# Patient Record
Sex: Female | Born: 1960 | Race: Black or African American | Hispanic: No | Marital: Married | Smoking: Never smoker
Health system: Southern US, Community
[De-identification: ages and names within clinical notes are randomized; demographics above are authoritative.]

## PROBLEM LIST (undated history)

## (undated) DIAGNOSIS — I10 Essential (primary) hypertension: Secondary | ICD-10-CM

## (undated) DIAGNOSIS — J45909 Unspecified asthma, uncomplicated: Secondary | ICD-10-CM

---

## 2014-02-08 ENCOUNTER — Encounter (HOSPITAL_BASED_OUTPATIENT_CLINIC_OR_DEPARTMENT_OTHER): Payer: Self-pay | Admitting: Emergency Medicine

## 2014-02-08 ENCOUNTER — Emergency Department (HOSPITAL_BASED_OUTPATIENT_CLINIC_OR_DEPARTMENT_OTHER)
Admission: EM | Admit: 2014-02-08 | Discharge: 2014-02-08 | Disposition: A | Discharge status: Home / Self Care | Payer: Self-pay | Attending: Emergency Medicine | Admitting: Emergency Medicine

## 2014-02-08 DIAGNOSIS — I10 Essential (primary) hypertension: Secondary | ICD-10-CM | POA: Insufficient documentation

## 2014-02-08 DIAGNOSIS — J45909 Unspecified asthma, uncomplicated: Secondary | ICD-10-CM | POA: Insufficient documentation

## 2014-02-08 DIAGNOSIS — R040 Epistaxis: Secondary | ICD-10-CM | POA: Insufficient documentation

## 2014-02-08 DIAGNOSIS — Z79899 Other long term (current) drug therapy: Secondary | ICD-10-CM | POA: Insufficient documentation

## 2014-02-08 HISTORY — DX: Essential (primary) hypertension: I10

## 2014-02-08 HISTORY — DX: Unspecified asthma, uncomplicated: J45.909

## 2014-02-08 MED ORDER — OXYMETAZOLINE HCL 0.05 % NA SOLN
NASAL | Status: AC
  Administered 2014-02-08: 2
  Filled 2014-02-08: qty 15

## 2014-02-08 MED ORDER — LIDOCAINE HCL 2 % EX GEL
CUTANEOUS | Status: AC
  Administered 2014-02-08: 1
  Filled 2014-02-08: qty 20

## 2014-02-08 NOTE — ED Notes (Signed)
Rt side nose bleed,  Woke and sneezed  Nose started bleeding  Started about 1 hour pta

## 2014-02-08 NOTE — Discharge Instructions (Signed)
Nosebleed °A nosebleed can be caused by many things, including: °· Getting hit hard in the nose. °· Infections. °· Dry nose. °· Colds. °· Medicines. °Your doctor may do lab testing if you get nosebleeds a lot and the cause is not known. °HOME CARE  °· If your nose was packed with material, keep it there until your doctor takes it out. Put the pack back in your nose if the pack falls out. °· Do not blow your nose for 12 hours after the nosebleed. °· Sit up and bend forward if your nose starts bleeding again. Pinch the front half of your nose nonstop for 20 minutes. °· Put petroleum jelly inside your nose every morning if you have a dry nose. °· Use a humidifier to make the air less dry. °· Do not take aspirin. °· Try not to strain, lift, or bend at the waist for many days after the nosebleed. °GET HELP RIGHT AWAY IF:  °· Nosebleeds keep happening and are hard to stop or control. °· You have bleeding or bruises that are not normal on other parts of the body. °· You have a fever. °· The nosebleeds get worse. °· You get lightheaded, feel faint, sweaty, or throw up (vomit) blood. °MAKE SURE YOU:  °· Understand these instructions. °· Will watch your condition. °· Will get help right away if you are not doing well or get worse. °Document Released: 05/04/2008 Document Revised: 10/18/2011 Document Reviewed: 05/04/2008 °[employer]® Patient Information ©2015 [employer]. This information is not intended to replace advice given to you by your health care provider. Make sure you discuss any questions you have with your health care provider. ° °

## 2014-02-08 NOTE — ED Provider Notes (Signed)
CSN: [id number]     Arrival date & time 02/08/14  0028 History   First MD Initiated Contact with Patient 02/08/14 0102     Chief Complaint  Patient presents with  . Epistaxis     (Consider location/radiation/quality/duration/timing/severity/associated sxs/prior Treatment) HPI This is a 53 year old female who comes in tonight secondary to nosebleed. The bleeding had come from her right nares. She cannot identify any instigating factor. She has had nosebleeds in the past. She denies any trauma but has had some nasal congestion and sneezing. She has felt pressure on it but had recurrent bleeding. She's not on any blood thinners. She has not weak or lightheaded. She has not vomited. Past Medical History  Diagnosis Date  . Hypertension   . Bronchitis, asthmatic    History reviewed. No pertinent past surgical history. No family history on file. History  Substance Use Topics  . Smoking status: Never Smoker   . Smokeless tobacco: Not on file  . Alcohol Use: No   OB History   Grav Para Term Preterm Abortions TAB SAB Ect Mult Living                 Review of Systems  All other systems reviewed and are negative.     Allergies  Review of patient's allergies indicates no known allergies.  Home Medications   Prior to Admission medications   Medication Sig Start Date End Date Taking? Authorizing Provider  atenolol (TENORMIN) 50 MG tablet Take 50 mg by mouth daily.   Yes Historical Provider, MD   BP 168/95  Pulse 82  Temp(Src) 98 F (36.7 C) (Oral)  Resp 18  Ht 4\' 10"  (1.473 m)  Wt 165 lb (74.844 kg)  BMI 34.49 kg/m2  SpO2 100% Physical Exam  Nursing note and vitals reviewed. Constitutional: She is oriented to person, place, and time. She appears well-developed and well-nourished.  HENT:  Head: Normocephalic and atraumatic.  Right Ear: External ear normal.  Left Ear: External ear normal.  Nose: Epistaxis is observed.  Eyes: Conjunctivae and EOM are normal. Pupils are  equal, round, and reactive to light.  Neck: Normal range of motion. Neck supple.  Cardiovascular: Normal rate, regular rhythm, normal heart sounds and intact distal pulses.   Pulmonary/Chest: Effort normal and breath sounds normal.  Abdominal: Soft. Bowel sounds are normal.  Musculoskeletal: Normal range of motion.  Neurological: She is alert and oriented to person, place, and time. She has normal reflexes.  Skin: Skin is warm and dry.  Psychiatric: She has a normal mood and affect. Her behavior is normal. Judgment and thought content normal.    ED Course  EPISTAXIS MANAGEMENT Date/Time: 02/08/2014 2:12 AM Performed by: Hilario QuarryAY, Jermarcus Mcfadyen S Authorized by: Hilario QuarryAY, Richanda Darin S Consent: Verbal consent obtained. Patient identity confirmed: verbally with patient Time out: Immediately prior to procedure a "time out" was called to verify the correct patient, procedure, equipment, support staff and site/side marked as required. Local anesthetic: co-phenylcaine spray and topical anesthetic Anesthetic total (ml): Afrin and lidocaine jelly were applied. Treatment site: right anterior Repair method: silver nitrate Post-procedure assessment: bleeding stopped Treatment complexity: simple Patient tolerance: Patient tolerated the procedure well with no immediate complications.   (including critical care time) Labs Review Labs Reviewed - No data to display  Imaging Review No results found.   EKG Interpretation None      MDM   Final diagnoses:  Epistaxis  Essential hypertension    1 epistaxis controlled here with silver nitrate after pressure and  Afrin. Patient educated regarding no nose manipulation and not raising pressure to control bleeding. She understands return precautions. 2 hypertension patient initially. Hypertensive but this has come down to 165/92. She has known hypertension. She will have this rechecked by her doctor next week.    Hilario Quarryanielle S Scottlynn Lindell, MD 02/08/14 [phone]

## 2014-03-16 ENCOUNTER — Emergency Department (HOSPITAL_BASED_OUTPATIENT_CLINIC_OR_DEPARTMENT_OTHER)
Admission: EM | Admit: 2014-03-16 | Discharge: 2014-03-16 | Disposition: A | Discharge status: Home / Self Care | Payer: Self-pay | Attending: Emergency Medicine | Admitting: Emergency Medicine

## 2014-03-16 ENCOUNTER — Encounter (HOSPITAL_BASED_OUTPATIENT_CLINIC_OR_DEPARTMENT_OTHER): Payer: Self-pay | Admitting: Emergency Medicine

## 2014-03-16 DIAGNOSIS — Z79899 Other long term (current) drug therapy: Secondary | ICD-10-CM | POA: Insufficient documentation

## 2014-03-16 DIAGNOSIS — R040 Epistaxis: Secondary | ICD-10-CM | POA: Insufficient documentation

## 2014-03-16 DIAGNOSIS — I10 Essential (primary) hypertension: Secondary | ICD-10-CM | POA: Insufficient documentation

## 2014-03-16 MED ORDER — SILVER NITRATE-POT NITRATE 75-25 % EX MISC
CUTANEOUS | Status: AC
  Filled 2014-03-16: qty 1

## 2014-03-16 MED ORDER — OXYMETAZOLINE HCL 0.05 % NA SOLN
NASAL | Status: AC
  Administered 2014-03-16: 10:00:00
  Filled 2014-03-16: qty 15

## 2014-03-16 NOTE — ED Notes (Signed)
Patient having a nosebleed that started this morning at 7:30. History of same

## 2014-03-16 NOTE — ED Provider Notes (Signed)
CSN: [id number]     Arrival date & time 03/16/14  0808 History   First MD Initiated Contact with Patient 03/16/14 [phone]     Chief Complaint  Patient presents with  . Epistaxis     Patient is a 53 y.o. female presenting with nosebleeds. The history is provided by the patient.  Epistaxis Location:  R nare Severity:  Moderate Duration:  20 minutes Timing:  Constant Progression:  Improving Chronicity:  Recurrent Context: not trauma   Relieved by:  Applying pressure Worsened by:  Nothing tried Associated symptoms: no fever   Risk factors: frequent nosebleeds     Past Medical History  Diagnosis Date  . Hypertension   . Bronchitis, asthmatic    History reviewed. No pertinent past surgical history. No family history on file. History  Substance Use Topics  . Smoking status: Never Smoker   . Smokeless tobacco: Not on file  . Alcohol Use: No   OB History   Grav Para Term Preterm Abortions TAB SAB Ect Mult Living                 Review of Systems  Constitutional: Negative for fever.  HENT: Positive for nosebleeds.       Allergies  Review of patient's allergies indicates no known allergies.  Home Medications   Prior to Admission medications   Medication Sig Start Date End Date Taking? Authorizing Provider  atenolol (TENORMIN) 50 MG tablet Take 50 mg by mouth daily.    Historical Provider, MD   BP 185/91  Pulse 82  Temp(Src) 97.6 F (36.4 C) (Oral)  Resp 20  Ht 4\' 10"  (1.473 m)  Wt 165 lb (74.844 kg)  BMI 34.49 kg/m2  SpO2 98% Physical Exam CONSTITUTIONAL: Well developed/well nourished HEAD: Normocephalic/atraumatic EYES: EOMI/PERRL ENMT: Mucous membranes moist. Pt holding pressure on arrival.  No blood in oropharynx No signs of facial trauma.  No bruising or crepitus to face NECK: supple no meningeal signs CV: S1/S2 noted, no murmurs/rubs/gallops noted LUNGS: Lungs are clear to auscultation bilaterally, no apparent distress ABDOMEN: soft, nontender, no  rebound or guarding NEURO: Pt is awake/alert, moves all extremitiesx4 EXTREMITIES: pulses normal, full ROM SKIN: warm, color normal PSYCH: no abnormalities of mood noted   ED Course  EPISTAXIS MANAGEMENT Date/Time: 03/16/2014 9:15 AM Performed by: Joya GaskinsWICKLINE, Terran Klinke W Authorized by: Joya GaskinsWICKLINE, Ema Hebner W Consent: Verbal consent obtained. Treatment site: right anterior Post-procedure assessment: bleeding stopped Treatment complexity: simple Patient tolerance: Patient tolerated the procedure well with no immediate complications. Comments: Afrin applied No evidence of bleeding after application of afrin and consistent pressure    MDM   Final diagnoses:  Epistaxis  Essential hypertension    Nursing notes including past medical history and social history reviewed and considered in documentation Previous records reviewed and considered     Joya Gaskinsonald W Jalien Weakland, MD 03/16/14 [phone]

## 2014-09-25 ENCOUNTER — Emergency Department (HOSPITAL_BASED_OUTPATIENT_CLINIC_OR_DEPARTMENT_OTHER)
Admission: EM | Admit: 2014-09-25 | Discharge: 2014-09-25 | Disposition: A | Discharge status: Home / Self Care | Payer: Self-pay | Attending: Emergency Medicine | Admitting: Emergency Medicine

## 2014-09-25 ENCOUNTER — Encounter (HOSPITAL_BASED_OUTPATIENT_CLINIC_OR_DEPARTMENT_OTHER): Payer: Self-pay | Admitting: Emergency Medicine

## 2014-09-25 DIAGNOSIS — J45909 Unspecified asthma, uncomplicated: Secondary | ICD-10-CM | POA: Insufficient documentation

## 2014-09-25 DIAGNOSIS — Z79899 Other long term (current) drug therapy: Secondary | ICD-10-CM | POA: Insufficient documentation

## 2014-09-25 DIAGNOSIS — I10 Essential (primary) hypertension: Secondary | ICD-10-CM | POA: Insufficient documentation

## 2014-09-25 DIAGNOSIS — R040 Epistaxis: Secondary | ICD-10-CM | POA: Insufficient documentation

## 2014-09-25 MED ORDER — AMLODIPINE BESYLATE 5 MG PO TABS
10.0000 mg | ORAL_TABLET | Freq: Once | ORAL | Status: AC
  Administered 2014-09-25: 10 mg via ORAL
  Filled 2014-09-25: qty 2

## 2014-09-25 MED ORDER — SALINE SPRAY 0.65 % NA SOLN: 2.0000 | NASAL | Status: AC | PRN

## 2014-09-25 MED ORDER — SILVER NITRATE-POT NITRATE 75-25 % EX MISC
CUTANEOUS | Status: AC
  Filled 2014-09-25: qty 1

## 2014-09-25 MED ORDER — HYDROCHLOROTHIAZIDE 12.5 MG PO TABS: 12.5000 mg | ORAL_TABLET | Freq: Every day | ORAL | Status: AC

## 2014-09-25 MED ORDER — OXYMETAZOLINE HCL 0.05 % NA SOLN
NASAL | Status: AC
  Administered 2014-09-25: 05:00:00
  Filled 2014-09-25: qty 15

## 2014-09-25 NOTE — ED Provider Notes (Signed)
CSN: [id number]     Arrival date & time 09/25/14  [id number] History   First MD Initiated Contact with Patient 09/25/14 [phone]     Chief Complaint  Patient presents with  . Epistaxis     (Consider location/radiation/quality/duration/timing/severity/associated sxs/prior Treatment) Patient is a 54 y.o. female presenting with nosebleeds. The history is provided by the patient.  Epistaxis Location:  L nare Severity:  Moderate Timing:  Constant Progression:  Resolved Chronicity:  New Context: not anticoagulants and not aspirin use   Relieved by:  Nothing Worsened by:  Nothing tried Ineffective treatments:  None tried Associated symptoms: no blood in oropharynx, no congestion, no facial pain, no fever and no sneezing   Risk factors: frequent nosebleeds   Risk factors: no change in medication     Past Medical History  Diagnosis Date  . Hypertension   . Bronchitis, asthmatic    History reviewed. No pertinent past surgical history. History reviewed. No pertinent family history. History  Substance Use Topics  . Smoking status: Never Smoker   . Smokeless tobacco: Not on file  . Alcohol Use: No   OB History    No data available     Review of Systems  Constitutional: Negative for fever.  HENT: Positive for nosebleeds. Negative for congestion, rhinorrhea and sneezing.   All other systems reviewed and are negative.     Allergies  Review of patient's allergies indicates no known allergies.  Home Medications   Prior to Admission medications   Medication Sig Start Date End Date Taking? Authorizing Provider  hydrochlorothiazide (MICROZIDE) 12.5 MG capsule Take 12.5 mg by mouth daily.   Yes Historical Provider, MD  atenolol (TENORMIN) 50 MG tablet Take 50 mg by mouth daily.    Historical Provider, MD   BP 215/109 mmHg  Pulse 69  Temp(Src) 99 F (37.2 C) (Oral)  Resp 18  Ht 4\' 11"  (1.499 m)  Wt 165 lb (74.844 kg)  BMI 33.31 kg/m2  SpO2 100% Physical Exam  Constitutional: She  is oriented to person, place, and time. She appears well-developed and well-nourished. No distress.  HENT:  Head: Normocephalic and atraumatic.  Nose: No nasal deformity, septal deviation or nasal septal hematoma. No epistaxis.  Mouth/Throat: Oropharynx is clear and moist. No oropharyngeal exudate.  Redness in left kisselbach's plexus  Eyes: Conjunctivae are normal. Pupils are equal, round, and reactive to light.  Neck: Normal range of motion. Neck supple.  Cardiovascular: Normal rate, regular rhythm and intact distal pulses.   Pulmonary/Chest: Effort normal and breath sounds normal. No respiratory distress. She has no wheezes. She has no rales.  Abdominal: Soft. Bowel sounds are normal. There is no tenderness.  Musculoskeletal: Normal range of motion.  Neurological: She is alert and oriented to person, place, and time.  Skin: Skin is warm and dry.  Psychiatric: She has a normal mood and affect.    ED Course  Procedures (including critical care time) Labs Review Labs Reviewed - No data to display  Imaging Review No results found.   EKG Interpretation None      MDM   Final diagnoses:  None    Afrin 1 spray in each nostril twice a day x 2 days ONLY then saline drops daily.  Strict return precautions given. Recheck at your family doctor in 48 hours.  Patient is asymptomatic but out of her HCTZ will give 30 day supply    Shah Insley K Meko Masterson-Rasch, MD 09/25/14 [phone]

## 2014-09-25 NOTE — ED Notes (Signed)
Nosebleed starting about ago

## 2014-09-25 NOTE — Discharge Instructions (Signed)
Nosebleed A nosebleed can be caused by many things, including:  Getting hit hard in the nose.  Infections.  Dry nose.  Colds.  Medicines. Your doctor may do lab testing if you get nosebleeds a lot and the cause is not known. HOME CARE   If your nose was packed with material, keep it there until your doctor takes it out. Put the pack back in your nose if the pack falls out.  Do not blow your nose for 12 hours after the nosebleed.  Sit up and bend forward if your nose starts bleeding again. Pinch the front half of your nose nonstop for 20 minutes.  Put petroleum jelly inside your nose every morning if you have a dry nose.  Use a humidifier to make the air less dry.  Do not take aspirin.  Try not to strain, lift, or bend at the waist for many days after the nosebleed. GET HELP RIGHT AWAY IF:   Nosebleeds keep happening and are hard to stop or control.  You have bleeding or bruises that are not normal on other parts of the body.  You have a fever.  The nosebleeds get worse.  You get lightheaded, feel faint, sweaty, or throw up (vomit) blood. MAKE SURE YOU:   Understand these instructions.  Will watch your condition.  Will get help right away if you are not doing well or get worse. Document Released: 05/04/2008 Document Revised: 10/18/2011 Document Reviewed: 05/04/2008 [facility] Patient Information 2015 [place], [place]. This information is not intended to replace advice given to you by your health care provider. Make sure you discuss any questions you have with your health care provider.  Emergency Department Resource Guide 1) Find a Doctor and Pay Out of Pocket Although you won't have to find out who is covered by your insurance plan, it is a good idea to ask around and get recommendations. You will then need to call the office and see if the doctor you have chosen will accept you as a new patient and what types of options they offer for patients who are self-pay. Some  doctors offer discounts or will set up payment plans for their patients who do not have insurance, but you will need to ask so you aren't surprised when you get to your appointment.  2) Contact Your Local Health Department Not all health departments have doctors that can see patients for sick visits, but many do, so it is worth a call to see if yours does. If you don't know where your local health department is, you can check in your phone book. The CDC also has a tool to help you locate your state's health department, and many state websites also have listings of all of their local health departments.  3) Find a Walk-in Clinic If your illness is not likely to be very severe or complicated, you may want to try a walk in clinic. These are popping up all over the country in pharmacies, drugstores, and shopping centers. They're usually staffed by nurse practitioners or physician assistants that have been trained to treat common illnesses and complaints. They're usually fairly quick and inexpensive. However, if you have serious medical issues or chronic medical problems, these are probably not your best option.  No Primary Care Doctor: - Call Health Connect at  [phone] - they can help you locate a primary care doctor that  accepts your insurance, provides certain services, etc. - Physician Referral Service- [phone]  Chronic Pain Problems: Organization  Address  Phone   Notes  [facility]  [phone] Patients need to be referred by their primary care doctor.   Medication Assistance: Organization         Address  Phone   Notes  [facility] Medication [facility] [address] [place], [place] [address] [phone] --Must be a resident of [facility] -- Must have NO insurance coverage whatsoever (no Medicaid/ Medicare, etc.) -- The pt. MUST have a primary care doctor that directs their care regularly and follows them in the  community   MedAssist  [phone]   [employer]  [phone]    Agencies that provide inexpensive medical care: Organization         Address  Phone   Notes  [place]  [phone]   [facility]    [phone]   [facility] [place], [place] [address] [phone]   [place] [address], [place] [phone]   Planned Parenthood    [phone]   [facility]    [phone]   [place] and [address], [place] Phone:  [phone], Fax:  [phone] Hours of Operation:  9 am - 6 pm, M-F.  Also accepts Medicaid/Medicare and self-pay.  [facility] for [place], [address], [place] Phone: [phone], Fax: [phone]. Hours of Operation:  8:30 am - 5:30 pm, M-F.  Also accepts Medicaid and self-pay.  [facility] [place] [address] [place] Phone: [phone]   [place], [place], [place] [phone], Ext. [phone] Mondays & Thursdays: 7-9 AM.  First 15 patients are seen on a first come, first serve basis.    [place] Providers:  Organization         Address  Phone   Notes  [facility] [address], [place] [phone] Also accepts self-pay patients.  [facility] [address], [place]  [phone]   [place], [address], [place] [phone]   [facility] [address], [place] [phone]   Lucianne Lei [address], [place]   [phone] Only accepts [place] Access [place] patients after they have their name applied to their card.   Self-Pay (no insurance) in [place] [facility]:  Organization         Address  Phone   Notes  Sickle Cell Patients, [facility]  Internal Medicine [place] [phone]   [facility] Urgent Care [place] [phone]   [facility] [place]  [place], [place], [place] [phone]   [facility]/Dr. Osei-Bonsu  [address], [place] or [address], [place] [phone] Phone number for both [place] and [place] locations is the same.  Urgent Medical and [facility] [address] [place] [phone]   [facility] [address] [place] or [address] [phone]   [facility] [address] [place] [phone], phone; [phone], fax Sees patients 1st and 3rd Saturday of every month.  Must not qualify  for public or private insurance (i.e. Medicaid, Medicare, [place] Health Choice, Veterans' Benefits)  Household income should be no more than 200% of the poverty level The clinic cannot treat you if you are pregnant or think you are pregnant  Sexually transmitted diseases are not treated at the clinic.    Dental Care: Organization         Address  Phone  Notes  [facility] [place] [phone] Accepts children up to age 69 who are enrolled in [place] or [place]; pregnant women with a Medicaid card; and children who have applied for Medicaid or [place] Health Choice, but were declined, whose parents can pay a reduced fee at time of service.  [facility] Department of [facility]  [address], [place] [phone] Accepts children up to age 63 who are enrolled in [place] or [place]; pregnant women with a Medicaid card; and children who have applied for Medicaid or [place] Health Choice, but were declined, whose parents can pay a reduced fee at time of service.  [place] Adult Dental Access PROGRAM  [place] [phone] Patients are seen by appointment only. Walk-ins are not accepted. [place] will see patients 2 years of age and older. Monday - Tuesday (8am-5pm) Most Wednesdays (8:30-5pm) $30 per visit, cash only  [facility] Adult Dental Access PROGRAM  [address], [facility] [phone] Patients are seen by appointment only. Walk-ins are not accepted. [place] will see patients 63 years of age and older. One Wednesday Evening (Monthly: Volunteer Based).  $30 per visit, cash only  [place]  [phone] for adults; Children under age 59, call Graduate Pediatric Dentistry at [phone]. Children aged 63-14, please call [phone] to request a pediatric application.  Dental services are provided in all areas of dental care including fillings, crowns and bridges, complete and partial dentures, implants, gum treatment, root canals, and extractions. Preventive care is also provided. Treatment is provided to both adults and children. Patients are selected via a lottery and there is often a waiting list.   [facility] [address] [place]  [phone] [email or web address]   [facility] [address] [place], [place] [phone], Ext. [phone] Second and Fourth Thursday of each month, opens at 6:30 AM; Clinic ends at 9 AM.  Patients are seen on a first-come first-served basis, and a limited number are seen during each clinic.   [facility]  [address] Hillard Danker [place], [place] [phone]   Eligibility Requirements You must have lived in [place], [place], or [place] counties for at least the last three months.   You cannot be eligible for state or federal sponsored [employer], including [employer], [place], or [employer].   You generally cannot be eligible for healthcare insurance through your employer.    How to apply: Eligibility screenings are held every  Tuesday and Wednesday afternoon from 1:00 pm until 4:00 pm. You do not need an appointment for the interview!  [facility] [address], [place], [place]   [place]  [place] Department  [place]  [phone]    Behavioral Health Resources in the Community: Intensive Outpatient Programs Organization         Address  Phone  Notes  [facility] [address] [place], [place] [phone]   [facility] Outpatient [address] [place], [place]   ADS: Alcohol & Drug Svcs [address], [place], [place]   [place] [address]  [place], [place] or [phone]   Substance Abuse Resources Organization         Address  Phone  Notes  Alcohol and Drug Services  [phone]   [place]  [phone]   [facility] [place]   Chinita Pester  [phone]   Residential & Outpatient Substance Abuse Program  [phone]   Psychological Services Organization         Address  Phone  Notes  [facility]  [place]  [phone]   [place] [address], [place] or [phone]    Mobile Crisis Teams Organization         Address  Phone  Notes  [facility], Mobile Crisis Care Unit  [phone]   [facility]  [address] [place], [place]   [place] [address] [place] [phone]    Self-Help/Support Groups Organization         Address  Phone             Notes  [place] of [place] - variety of support groups  [place] Call for more information  Narcotics Anonymous (NA), [facility] [address], [employer] [place]  2 meetings at this location    Special educational needs teacher         Address  Phone  Notes  ASAP Residential Treatment [place],    [place]  [phone]   [facility]  [address], [place] [phone], [place], [place]   [place], [place] [phone] Admissions: 8am-3pm M-F  Incentives Substance [place] [address]    [place], [place] [phone]   [facility] [address] [place], [place], [place]   [facility] [address],  [place], [place]   [facility]ight Programs - Intensive Outpatient [address] Kristeen [place] [address], [place], [place]   [facility] ([place].) [place].,  [place], [place] [phone] or [phone]   [facility]idential Treatment Services (RTS) [address] [place], [place] Accepts Medicaid  [facility] [place] [address],  [place] [phone] Substance Abuse/Addiction Treatment   [facility] Organization         Address  Phone  Notes  [facility]  [phone]   Domenic Schwab, PhD [address] Arlis Porta [place], [place]   [phone] or [phone]   [place]   [address] [place], [place] [phone]   [facility] [address], [place], [place] [phone] Insurance/Medicaid/sponsorship through [facility] and Families [address], [place]                                    [place], [place] [phone] [place] [address], [place] [phone]    Dr. Adele Schilder  [phone]   [facility] [place]  [facility] 1) [address], [place] 2) [address] [place] 3)  [address], [place] [phone]  [phone]   [facility] Child Abuse Hotline [phone] or [phone] (After  Hours)

## 2014-09-25 NOTE — ED Notes (Signed)
Bleeding remains controlled at this time.

## 2014-10-20 ENCOUNTER — Encounter (HOSPITAL_BASED_OUTPATIENT_CLINIC_OR_DEPARTMENT_OTHER): Payer: Self-pay | Admitting: *Deleted

## 2014-10-20 ENCOUNTER — Emergency Department (HOSPITAL_BASED_OUTPATIENT_CLINIC_OR_DEPARTMENT_OTHER): Payer: Self-pay

## 2014-10-20 ENCOUNTER — Emergency Department (HOSPITAL_BASED_OUTPATIENT_CLINIC_OR_DEPARTMENT_OTHER)
Admission: EM | Admit: 2014-10-20 | Discharge: 2014-10-20 | Disposition: A | Discharge status: Home / Self Care | Payer: Self-pay | Attending: Emergency Medicine | Admitting: Emergency Medicine

## 2014-10-20 DIAGNOSIS — N200 Calculus of kidney: Secondary | ICD-10-CM

## 2014-10-20 DIAGNOSIS — J45909 Unspecified asthma, uncomplicated: Secondary | ICD-10-CM | POA: Insufficient documentation

## 2014-10-20 DIAGNOSIS — I10 Essential (primary) hypertension: Secondary | ICD-10-CM | POA: Insufficient documentation

## 2014-10-20 DIAGNOSIS — R319 Hematuria, unspecified: Secondary | ICD-10-CM

## 2014-10-20 DIAGNOSIS — Z79899 Other long term (current) drug therapy: Secondary | ICD-10-CM | POA: Insufficient documentation

## 2014-10-20 LAB — URINALYSIS, ROUTINE W REFLEX MICROSCOPIC
Bilirubin Urine: NEGATIVE
Glucose, UA: NEGATIVE mg/dL
Ketones, ur: NEGATIVE mg/dL
Nitrite: NEGATIVE
PROTEIN: NEGATIVE mg/dL
Specific Gravity, Urine: 1.013 (ref 1.005–1.030)
Urobilinogen, UA: 0.2 mg/dL (ref 0.0–1.0)
pH: 6 (ref 5.0–8.0)

## 2014-10-20 LAB — WET PREP, GENITAL
CLUE CELLS WET PREP: NONE SEEN
TRICH WET PREP: NONE SEEN
WBC WET PREP: NONE SEEN
Yeast Wet Prep HPF POC: NONE SEEN

## 2014-10-20 LAB — URINE MICROSCOPIC-ADD ON

## 2014-10-20 MED ORDER — TAMSULOSIN HCL 0.4 MG PO CAPS: 0.4000 mg | ORAL_CAPSULE | Freq: Every day | ORAL | Status: AC

## 2014-10-20 MED ORDER — OXYCODONE-ACETAMINOPHEN 5-325 MG PO TABS: 1.0000 | ORAL_TABLET | ORAL | Status: AC | PRN

## 2014-10-20 MED ORDER — IBUPROFEN 600 MG PO TABS: 600.0000 mg | ORAL_TABLET | Freq: Four times a day (QID) | ORAL | Status: AC | PRN

## 2014-10-20 MED ORDER — KETOROLAC TROMETHAMINE 60 MG/2ML IM SOLN
60.0000 mg | Freq: Once | INTRAMUSCULAR | Status: AC
  Administered 2014-10-20: 60 mg via INTRAMUSCULAR
  Filled 2014-10-20: qty 2

## 2014-10-20 MED ORDER — OXYCODONE-ACETAMINOPHEN 5-325 MG PO TABS
1.0000 | ORAL_TABLET | Freq: Once | ORAL | Status: AC
  Administered 2014-10-20: 1 via ORAL
  Filled 2014-10-20: qty 1

## 2014-10-20 NOTE — ED Provider Notes (Signed)
CSN: [id number]     Arrival date & time 10/20/14  1521 History   First MD Initiated Contact with Patient 10/20/14 1617     Chief Complaint  Patient presents with  . Hematuria     (Consider location/radiation/quality/duration/timing/severity/associated sxs/prior Treatment) HPI Penny Washington is a 54 y.o. female who presents to ED with complaint of abdominal pain and hematuria. Patient states pain is for the last 2 days, today noticed blood in her urine. She denies any vaginal bleeding. She denies any vaginal discharge. No pain with intercourse. States pain is in the right lower abdomen. She denies any back or flank pain. She denies any nausea or vomiting. Normal bowel movement today. No blood in her stool. She has been taking over-the-counter ibuprofen with no relief of her symptoms. She denies any prior history of hematuria. She denies dysuria, urinary frequency or urgency. Nothing is making her symptoms better or worse. She states she has good appetite, no fever or chills.  Past Medical History  Diagnosis Date  . Hypertension   . Bronchitis, asthmatic    History reviewed. No pertinent past surgical history. No family history on file. History  Substance Use Topics  . Smoking status: Never Smoker   . Smokeless tobacco: Not on file  . Alcohol Use: Yes     Comment: rare   OB History    No data available     Review of Systems  Constitutional: Negative for fever and chills.  Respiratory: Negative for cough, chest tightness and shortness of breath.   Cardiovascular: Negative for chest pain, palpitations and leg swelling.  Gastrointestinal: Positive for abdominal pain. Negative for nausea, vomiting and diarrhea.  Genitourinary: Positive for hematuria. Negative for dysuria, flank pain, vaginal bleeding, vaginal discharge, vaginal pain and pelvic pain.  Musculoskeletal: Negative for myalgias, arthralgias, neck pain and neck stiffness.  Skin: Negative for rash.  Neurological: Negative for  dizziness, weakness and headaches.  All other systems reviewed and are negative.     Allergies  Review of patient's allergies indicates no known allergies.  Home Medications   Prior to Admission medications   Medication Sig Start Date End Date Taking? Authorizing Provider  atenolol (TENORMIN) 50 MG tablet Take 25 mg by mouth 2 (two) times daily.    Yes Historical Provider, MD  hydrochlorothiazide (MICROZIDE) 12.5 MG capsule Take 25 mg by mouth daily.    Yes Historical Provider, MD  hydrochlorothiazide (HYDRODIURIL) 12.5 MG tablet Take 1 tablet (12.5 mg total) by mouth daily. 09/25/14   April Palumbo, MD  sodium chloride (OCEAN) 0.65 % SOLN nasal spray Place 2 sprays into both nostrils as needed for congestion. 09/25/14   April Palumbo, MD   BP 153/78 mmHg  Pulse 60  Temp(Src) 98.4 F (36.9 C) (Oral)  Resp 18  Ht 4\' 10"  (1.473 m)  Wt 165 lb (74.844 kg)  BMI 34.49 kg/m2  SpO2 97% Physical Exam  Constitutional: She is oriented to person, place, and time. She appears well-developed and well-nourished. No distress.  HENT:  Head: Normocephalic.  Eyes: Conjunctivae are normal.  Neck: Neck supple.  Cardiovascular: Normal rate, regular rhythm and normal heart sounds.   Pulmonary/Chest: Effort normal and breath sounds normal. No respiratory distress. She has no wheezes. She has no rales.  Abdominal: Soft. Bowel sounds are normal. She exhibits no distension. There is tenderness. There is no rebound.  Right lower quadrant tenderness. No CVA tenderness bilaterally  Genitourinary:  Normal external genitalia. Normal vaginal canal. Small thin white discharge. Cervix is  normal, closed. No CMT. No uterine or adnexal tenderness. No masses palpated.    Musculoskeletal: She exhibits no edema.  Neurological: She is alert and oriented to person, place, and time.  Skin: Skin is warm and dry.  Psychiatric: She has a normal mood and affect. Her behavior is normal.  Nursing note and vitals  reviewed.   ED Course  Procedures (including critical care time) Labs Review Labs Reviewed  URINALYSIS, ROUTINE W REFLEX MICROSCOPIC - Abnormal; Notable for the following:    APPearance CLOUDY (*)    Hgb urine dipstick LARGE (*)    Leukocytes, UA TRACE (*)    All other components within normal limits  WET PREP, GENITAL  URINE MICROSCOPIC-ADD ON  GC/CHLAMYDIA PROBE AMP ()    Imaging Review Ct Abdomen Pelvis Wo Contrast  10/20/2014   CLINICAL DATA:  Right-sided abdominal pain and hematuria starting on Friday. Hypertension.  EXAM: CT ABDOMEN AND PELVIS WITHOUT CONTRAST  TECHNIQUE: Multidetector CT imaging of the abdomen and pelvis was performed following the standard protocol without IV contrast.  COMPARISON:  None.  FINDINGS: Lower chest:  Unremarkable  Hepatobiliary: Unremarkable  Pancreas: Unremarkable  Spleen: Unremarkable  Adrenals/Urinary Tract: Right hydronephrosis and proximal hydroureter. Right proximal periureteral stranding. The ureter extends adjacent to a normal-appearing appendix and adjacent to the right ovary, and is highly indistinct in this vicinity. There is a 4 mm calcification just posterior to the right ovary which could be in the right ureter. The ureter itself in this vicinity is highly indistinct. The distal ureters difficult to visualize.  There is a 2 mm right kidney lower pole nonobstructive calculus. There is a potential 1 mm left kidney lower pole nonobstructive calculus. Fluid density 1.3 cm left mid kidney lesion, image 54 series 5.  Stomach/Bowel: Unremarkable  Vascular/Lymphatic: Aortoiliac atherosclerotic vascular disease.  Reproductive: The uterus is not well visualized. Has the patient had a hysterectomy? I do not see a record of fat in the patient's medical record. It is conceivable that a small retroverted uterus is obscured by surrounding bowel. Correlate with direct questioning of the patient on this issue.  1.6 cm hypodensity along the right ovary,  probably a cyst.  Other: No supplemental non-categorized findings.  Musculoskeletal: Degenerative facet arthropathy bilaterally at L4-5 with vacuum gas phenomenon in the facet joints and grade 1 anterolisthesis. The disc uncovering and facet arthropathy result in bilateral mild foraminal stenosis at this level.  IMPRESSION: 1. Right hydronephrosis and proximal hydroureter, probably due to a 4 mm stone in the pelvis. The distal ureter is highly indistinct and accordingly it is difficult to no on today's noncontrast exam whether this stone is in the ureter or not. However, it is the most likely candidate for the right hydronephrosis. 2. Bilateral tiny punctate nonobstructive renal calculi. 3.  Aortoiliac atherosclerotic vascular disease. 4. Small cyst, right ovary. 5. Uterus not well visualized.  Has the patient had a hysterectomy? 6. Bilateral mild foraminal impingement at L4-5 due to spondylosis and degenerative disc disease.   Electronically Signed   By: Gaylyn Rong M.D.   On: 10/20/2014 19:11     EKG Interpretation None      MDM   Final diagnoses:  Kidney stone  Hematuria   patient with right lower quadrant pain and hematuria. Her pelvic exam is unremarkable. No blood in vaginal canal. Her UA is significant for hematuria, no evidence of infection. There is no adnexal tenderness on exam, pain is higher. CT abdomen pelvis obtained for further evaluation.  8:02 PM Patient CT scan shows a 4 mm stone in pelvis. Right hydronephrosis and proximal hydroureter noted. This is most likely was causing patient's symptoms. Patient also has a small cyst in right ovary. Patient's symptoms are well-controlled at this time. Plan to discharge home with pain medications, Flomax, follow-up with urology. Discussed results with patient and discussed plan. Patient agrees with the plan. Return precautions discussed. Pt afebrile, no signs of urine infection, no spesis.   Filed Vitals:   10/20/14 1608 10/20/14  1820  BP: 177/95 153/78  Pulse: 78 60  Temp: 98.4 F (36.9 C)   TempSrc: Oral   Resp: 18 18  Height:  (1.473 m)   Weight: 165 lb (74.844 kg)   SpO2: 98% 97%     Jaynie Crumble, PA-C 10/21/14 0000  Jerelyn [place], MD 10/21/14 0004

## 2014-10-20 NOTE — Discharge Instructions (Signed)
Ibuprofen for pain. Percocet for severe pain. Flomax to help you passed the stone. Make sure you follow up with urology. Return if worsening symptoms, fever, any new concerning symptoms.   Kidney Stones Kidney stones (urolithiasis) are deposits that form inside your kidneys. The intense pain is caused by the stone moving through the urinary tract. When the stone moves, the ureter goes into spasm around the stone. The stone is usually passed in the urine.  CAUSES   A disorder that makes certain neck glands produce too much parathyroid hormone (primary hyperparathyroidism).  A buildup of uric acid crystals, similar to gout in your joints.  Narrowing (stricture) of the ureter.  A kidney obstruction present at birth (congenital obstruction).  Previous surgery on the kidney or ureters.  Numerous kidney infections. SYMPTOMS   Feeling sick to your stomach (nauseous).  Throwing up (vomiting).  Blood in the urine (hematuria).  Pain that usually spreads (radiates) to the groin.  Frequency or urgency of urination. DIAGNOSIS   Taking a history and physical exam.  Blood or urine tests.  CT scan.  Occasionally, an examination of the inside of the urinary bladder (cystoscopy) is performed. TREATMENT   Observation.  Increasing your fluid intake.  Extracorporeal shock wave lithotripsy--This is a noninvasive procedure that uses shock waves to break up kidney stones.  Surgery may be needed if you have severe pain or persistent obstruction. There are various surgical procedures. Most of the procedures are performed with the use of small instruments. Only small incisions are needed to accommodate these instruments, so recovery time is minimized. The size, location, and chemical composition are all important variables that will determine the proper choice of action for you. Talk to your health care provider to better understand your situation so that you will minimize the risk of injury to  yourself and your kidney.  HOME CARE INSTRUCTIONS   Drink enough water and fluids to keep your urine clear or pale yellow. This will help you to pass the stone or stone fragments.  Strain all urine through the provided strainer. Keep all particulate matter and stones for your health care provider to see. The stone causing the pain may be as small as a grain of salt. It is very important to use the strainer each and every time you pass your urine. The collection of your stone will allow your health care provider to analyze it and verify that a stone has actually passed. The stone analysis will often identify what you can do to reduce the incidence of recurrences.  Only take over-the-counter or prescription medicines for pain, discomfort, or fever as directed by your health care provider.  Make a follow-up appointment with your health care provider as directed.  Get follow-up X-rays if required. The absence of pain does not always mean that the stone has passed. It may have only stopped moving. If the urine remains completely obstructed, it can cause loss of kidney function or even complete destruction of the kidney. It is your responsibility to make sure X-rays and follow-ups are completed. Ultrasounds of the kidney can show blockages and the status of the kidney. Ultrasounds are not associated with any radiation and can be performed easily in a matter of minutes. SEEK MEDICAL CARE IF:  You experience pain that is progressive and unresponsive to any pain medicine you have been prescribed. SEEK IMMEDIATE MEDICAL CARE IF:   Pain cannot be controlled with the prescribed medicine.  You have a fever or shaking chills.  The  severity or intensity of pain increases over 18 hours and is not relieved by pain medicine.  You develop a new onset of abdominal pain.  You feel faint or pass out.  You are unable to urinate. MAKE SURE YOU:   Understand these instructions.  Will watch your  condition.  Will get help right away if you are not doing well or get worse. Document Released: 07/26/2005 Document Revised: 03/28/2013 Document Reviewed: 12/27/2012 [facility] Patient Information 2015 [place], [place]. This information is not intended to replace advice given to you by your health care provider. Make sure you discuss any questions you have with your health care provider.

## 2014-10-20 NOTE — ED Notes (Signed)
Right side abdominal pain and hematuria since friday

## 2014-10-22 LAB — GC/CHLAMYDIA PROBE AMP (~~LOC~~) NOT AT ARMC
Chlamydia: NEGATIVE
Neisseria Gonorrhea: NEGATIVE

## 2015-05-15 ENCOUNTER — Encounter (HOSPITAL_BASED_OUTPATIENT_CLINIC_OR_DEPARTMENT_OTHER): Payer: Self-pay | Admitting: *Deleted

## 2015-05-15 ENCOUNTER — Emergency Department (HOSPITAL_BASED_OUTPATIENT_CLINIC_OR_DEPARTMENT_OTHER)
Admission: EM | Admit: 2015-05-15 | Discharge: 2015-05-15 | Disposition: A | Discharge status: Home / Self Care | Payer: Self-pay | Attending: Emergency Medicine | Admitting: Emergency Medicine

## 2015-05-15 DIAGNOSIS — Z3202 Encounter for pregnancy test, result negative: Secondary | ICD-10-CM | POA: Insufficient documentation

## 2015-05-15 DIAGNOSIS — B373 Candidiasis of vulva and vagina: Secondary | ICD-10-CM | POA: Insufficient documentation

## 2015-05-15 DIAGNOSIS — N760 Acute vaginitis: Secondary | ICD-10-CM | POA: Insufficient documentation

## 2015-05-15 DIAGNOSIS — L309 Dermatitis, unspecified: Secondary | ICD-10-CM | POA: Insufficient documentation

## 2015-05-15 DIAGNOSIS — B9689 Other specified bacterial agents as the cause of diseases classified elsewhere: Secondary | ICD-10-CM

## 2015-05-15 DIAGNOSIS — Z79899 Other long term (current) drug therapy: Secondary | ICD-10-CM | POA: Insufficient documentation

## 2015-05-15 DIAGNOSIS — I10 Essential (primary) hypertension: Secondary | ICD-10-CM | POA: Insufficient documentation

## 2015-05-15 DIAGNOSIS — B3731 Acute candidiasis of vulva and vagina: Secondary | ICD-10-CM

## 2015-05-15 DIAGNOSIS — J45909 Unspecified asthma, uncomplicated: Secondary | ICD-10-CM | POA: Insufficient documentation

## 2015-05-15 LAB — URINALYSIS, ROUTINE W REFLEX MICROSCOPIC
Bilirubin Urine: NEGATIVE
Glucose, UA: NEGATIVE mg/dL
Ketones, ur: NEGATIVE mg/dL
Nitrite: NEGATIVE
PH: 6 (ref 5.0–8.0)
PROTEIN: NEGATIVE mg/dL
SPECIFIC GRAVITY, URINE: 1.024 (ref 1.005–1.030)
Urobilinogen, UA: 1 mg/dL (ref 0.0–1.0)

## 2015-05-15 LAB — WET PREP, GENITAL: TRICH WET PREP: NONE SEEN

## 2015-05-15 LAB — URINE MICROSCOPIC-ADD ON

## 2015-05-15 LAB — PREGNANCY, URINE: Preg Test, Ur: NEGATIVE

## 2015-05-15 MED ORDER — TRIAMCINOLONE ACETONIDE 0.1 % EX CREA: 1.0000 "application " | TOPICAL_CREAM | Freq: Two times a day (BID) | CUTANEOUS | Status: AC

## 2015-05-15 MED ORDER — METRONIDAZOLE 500 MG PO TABS
500.0000 mg | ORAL_TABLET | Freq: Two times a day (BID) | ORAL | Status: AC
Start: 2015-05-15 — End: ?

## 2015-05-15 MED ORDER — FLUCONAZOLE 150 MG PO TABS
ORAL_TABLET | ORAL | Status: AC
Start: 2015-05-15 — End: ?

## 2015-05-15 NOTE — ED Notes (Signed)
Pt. Reports she has had a shooting pain in the R side of her vagina.  Pt. Reports she has had discharge also with the color of white and some color.  Pt. reports a rash on her arms also.  Pt. Reports she has started using dial soap and some new detergent also.

## 2015-05-15 NOTE — ED Provider Notes (Signed)
CSN: [id number]     Arrival date & time 05/15/15  1547 History   First MD Initiated Contact with Patient 05/15/15 1559     Chief Complaint  Patient presents with  . Vaginal Pain     ) HPI  Patient presents evaluation of some vaginal pain and discharge. Patient was moving some boxes and thinks she may have had just a pulled muscle in her groin. There is some discharge intermittent over the last few weeks with some itching and burning and presents for evaluation.  Alternately she has some areas on her arms that are itching. In speaking with her she states that he feels similar to where she's had eczema on her neck in the past. Also states using a new soap at home.  Past Medical History  Diagnosis Date  . Hypertension   . Bronchitis, asthmatic    History reviewed. No pertinent past surgical history. No family history on file. Social History  Substance Use Topics  . Smoking status: Never Smoker   . Smokeless tobacco: None  . Alcohol Use: Yes     Comment: rare   OB History    No data available     Review of Systems  Constitutional: Negative for fever, chills, diaphoresis, appetite change and fatigue.  HENT: Negative for mouth sores, sore throat and trouble swallowing.   Eyes: Negative for visual disturbance.  Respiratory: Negative for cough, chest tightness, shortness of breath and wheezing.   Cardiovascular: Negative for chest pain.  Gastrointestinal: Negative for nausea, vomiting, abdominal pain, diarrhea and abdominal distention.  Endocrine: Negative for polydipsia, polyphagia and polyuria.  Genitourinary: Positive for vaginal discharge and vaginal pain. Negative for dysuria, frequency and hematuria.  Musculoskeletal: Negative for gait problem.  Skin: Positive for rash. Negative for color change and pallor.  Neurological: Negative for dizziness, syncope, light-headedness and headaches.  Hematological: Does not bruise/bleed easily.  Psychiatric/Behavioral: Negative for  behavioral problems and confusion.      Allergies  Review of patient's allergies indicates no known allergies.  Home Medications   Prior to Admission medications   Medication Sig Start Date End Date Taking? Authorizing Provider  lisinopril-hydrochlorothiazide (PRINZIDE,ZESTORETIC) 20-12.5 MG tablet Take 1 tablet by mouth daily.   Yes Historical Provider, MD  atenolol (TENORMIN) 50 MG tablet Take 25 mg by mouth 2 (two) times daily.     Historical Provider, MD  fluconazole (DIFLUCAN) 150 MG tablet 1 tablet by mouth on the last day of Flagyl. 05/15/15   Rolland Porter, MD  hydrochlorothiazide (HYDRODIURIL) 12.5 MG tablet Take 1 tablet (12.5 mg total) by mouth daily. 09/25/14   April Palumbo, MD  hydrochlorothiazide (MICROZIDE) 12.5 MG capsule Take 25 mg by mouth daily.     Historical Provider, MD  ibuprofen (ADVIL,MOTRIN) 600 MG tablet Take 1 tablet (600 mg total) by mouth every 6 (six) hours as needed. 10/20/14   Tatyana Kirichenko, PA-C  metroNIDAZOLE (FLAGYL) 500 MG tablet Take 1 tablet (500 mg total) by mouth 2 (two) times daily. 05/15/15   Rolland Porter, MD  oxyCODONE-acetaminophen (PERCOCET) 5-325 MG per tablet Take 1 tablet by mouth every 4 (four) hours as needed for severe pain. 10/20/14   Tatyana Kirichenko, PA-C  sodium chloride (OCEAN) 0.65 % SOLN nasal spray Place 2 sprays into both nostrils as needed for congestion. 09/25/14   April Palumbo, MD  tamsulosin (FLOMAX) 0.4 MG CAPS capsule Take 1 capsule (0.4 mg total) by mouth daily. 10/20/14   Tatyana Kirichenko, PA-C   BP 156/98 mmHg  Pulse  73  Temp(Src) 98 F (36.7 C) (Oral)  Resp 18  Ht  (1.473 m)  Wt 165 lb (74.844 kg)  BMI 34.49 kg/m2  SpO2 98% Physical Exam  Constitutional: She is oriented to person, place, and time. She appears well-developed and well-nourished. No distress.  HENT:  Head: Normocephalic.  Eyes: Conjunctivae are normal. Pupils are equal, round, and reactive to light. No scleral icterus.  Neck: Normal range of  motion. Neck supple. No thyromegaly present.  Cardiovascular: Normal rate and regular rhythm.  Exam reveals no gallop and no friction rub.   No murmur heard. Pulmonary/Chest: Effort normal and breath sounds normal. No respiratory distress. She has no wheezes. She has no rales.  Abdominal: Soft. Bowel sounds are normal. She exhibits no distension. There is no tenderness. There is no rebound.  Genitourinary:  No abscess or vesicles. Some tenderness along the right groin musculature.  Musculoskeletal: Normal range of motion.  Neurological: She is alert and oriented to person, place, and time.  Skin: Skin is warm and dry. No rash noted.     Psychiatric: She has a normal mood and affect. Her behavior is normal.    ED Course  Procedures (including critical care time) Labs Review Labs Reviewed  WET PREP, GENITAL - Abnormal; Notable for the following:    Yeast Wet Prep HPF POC FEW (*)    Clue Cells Wet Prep HPF POC FEW (*)    WBC, Wet Prep HPF POC FEW (*)    All other components within normal limits  URINALYSIS, ROUTINE W REFLEX MICROSCOPIC (NOT AT [facility]) - Abnormal; Notable for the following:    APPearance TURBID (*)    Hgb urine dipstick MODERATE (*)    Leukocytes, UA SMALL (*)    All other components within normal limits  URINE MICROSCOPIC-ADD ON - Abnormal; Notable for the following:    Squamous Epithelial / LPF FEW (*)    Bacteria, UA FEW (*)    All other components within normal limits  PREGNANCY, URINE  GC/CHLAMYDIA PROBE AMP () NOT AT [facility]    Imaging Review No results found. I have personally reviewed and evaluated these images and lab results as part of my medical decision-making.   EKG Interpretation None      MDM   Final diagnoses:  Candida vaginitis  Bacterial vaginosis    Has clue cells and yeast on pelvic exam. I think her tenderness and pain are likely muscular. Plan will be 7 days Flagyl, 1 day Diflucan. Steroid cream for the eczema.    Rolland Porter, MD 05/15/15 1723

## 2015-05-15 NOTE — Discharge Instructions (Signed)
Bacterial Vaginosis Bacterial vaginosis is a vaginal infection that occurs when the normal balance of bacteria in the vagina is disrupted. It results from an overgrowth of certain bacteria. This is the most common vaginal infection in women of childbearing age. Treatment is important to prevent complications, especially in pregnant women, as it can cause a premature delivery. CAUSES  Bacterial vaginosis is caused by an increase in harmful bacteria that are normally present in smaller amounts in the vagina. Several different kinds of bacteria can cause bacterial vaginosis. However, the reason that the condition develops is not fully understood. RISK FACTORS Certain activities or behaviors can put you at an increased risk of developing bacterial vaginosis, including:  Having a new sex partner or multiple sex partners.  Douching.  Using an intrauterine device (IUD) for contraception. Women do not get bacterial vaginosis from toilet seats, bedding, swimming pools, or contact with objects around them. SIGNS AND SYMPTOMS  Some women with bacterial vaginosis have no signs or symptoms. Common symptoms include:  Grey vaginal discharge.  A fishlike odor with discharge, especially after sexual intercourse.  Itching or burning of the vagina and vulva.  Burning or pain with urination. DIAGNOSIS  Your health care provider will take a medical history and examine the vagina for signs of bacterial vaginosis. A sample of vaginal fluid may be taken. Your health care provider will look at this sample under a microscope to check for bacteria and abnormal cells. A vaginal pH test may also be done.  TREATMENT  Bacterial vaginosis may be treated with antibiotic medicines. These may be given in the form of a pill or a vaginal cream. A second round of antibiotics may be prescribed if the condition comes back after treatment. Because bacterial vaginosis increases your risk for sexually transmitted diseases, getting  treated can help reduce your risk for chlamydia, gonorrhea, HIV, and herpes. HOME CARE INSTRUCTIONS   Only take over-the-counter or prescription medicines as directed by your health care provider.  If antibiotic medicine was prescribed, take it as directed. Make sure you finish it even if you start to feel better.  Tell all sexual partners that you have a vaginal infection. They should see their health care provider and be treated if they have problems, such as a mild rash or itching.  During treatment, it is important that you follow these instructions:  Avoid sexual activity or use condoms correctly.  Do not douche.  Avoid alcohol as directed by your health care provider.  Avoid breastfeeding as directed by your health care provider. SEEK MEDICAL CARE IF:   Your symptoms are not improving after 3 days of treatment.  You have increased discharge or pain.  You have a fever. MAKE SURE YOU:   Understand these instructions.  Will watch your condition.  Will get help right away if you are not doing well or get worse. FOR MORE INFORMATION  Centers for Disease Control and Prevention, Division of STD Prevention: www.cdc.gov/std American Sexual Health Association (ASHA): www.ashastd.org    This information is not intended to replace advice given to you by your health care provider. Make sure you discuss any questions you have with your health care provider.   Document Released: 07/26/2005 Document Revised: 08/16/2014 Document Reviewed: 03/07/2013 Elsevier Interactive Patient Education 2016 [employer] Monilial Vaginitis Vaginitis in a soreness, swelling and redness (inflammation) of the vagina and vulva. Monilial vaginitis is not a sexually transmitted infection. CAUSES  Yeast vaginitis is caused by yeast (candida) that is normally found in   your vagina. With a yeast infection, the candida has overgrown in number to a point that upsets the chemical balance. SYMPTOMS   White,  thick vaginal discharge.  Swelling, itching, redness and irritation of the vagina and possibly the lips of the vagina (vulva).  Burning or painful urination.  Painful intercourse. DIAGNOSIS  Things that may contribute to monilial vaginitis are:  Postmenopausal and virginal states.  Pregnancy.  Infections.  Being tired, sick or stressed, especially if you had monilial vaginitis in the past.  Diabetes. Good control will help lower the chance.  Birth control pills.  Tight fitting garments.  Using bubble bath, feminine sprays, douches or deodorant tampons.  Taking certain medications that kill germs (antibiotics).  Sporadic recurrence can occur if you become ill. TREATMENT  Your caregiver will give you medication.  There are several kinds of anti monilial vaginal creams and suppositories specific for monilial vaginitis. For recurrent yeast infections, use a suppository or cream in the vagina 2 times a week, or as directed.  Anti-monilial or steroid cream for the itching or irritation of the vulva may also be used. Get your caregiver's permission.  Painting the vagina with methylene blue solution may help if the monilial cream does not work.  Eating yogurt may help prevent monilial vaginitis. HOME CARE INSTRUCTIONS   Finish all medication as prescribed.  Do not have sex until treatment is completed or after your caregiver tells you it is okay.  Take warm sitz baths.  Do not douche.  Do not use tampons, especially scented ones.  Wear cotton underwear.  Avoid tight pants and panty hose.  Tell your sexual partner that you have a yeast infection. They should go to their caregiver if they have symptoms such as mild rash or itching.  Your sexual partner should be treated as well if your infection is difficult to eliminate.  Practice safer sex. Use condoms.  Some vaginal medications cause latex condoms to fail. Vaginal medications that harm condoms are:  Cleocin  cream.  Butoconazole (Femstat).  Terconazole (Terazol) vaginal suppository.  Miconazole (Monistat) (may be purchased over the counter). SEEK MEDICAL CARE IF:   You have a temperature by mouth above 102 F (38.9 C).  The infection is getting worse after 2 days of treatment.  The infection is not getting better after 3 days of treatment.  You develop blisters in or around your vagina.  You develop vaginal bleeding, and it is not your menstrual period.  You have pain when you urinate.  You develop intestinal problems.  You have pain with sexual intercourse.   This information is not intended to replace advice given to you by your health care provider. Make sure you discuss any questions you have with your health care provider.   Document Released: 05/05/2005 Document Revised: 10/18/2011 Document Reviewed: 01/27/2015 Elsevier Interactive Patient Education 2016 Elsevier Inc.  

## 2015-05-16 LAB — GC/CHLAMYDIA PROBE AMP (~~LOC~~) NOT AT ARMC
Chlamydia: NEGATIVE
Neisseria Gonorrhea: NEGATIVE

## 2016-10-13 IMAGING — CT CT ABD-PELV W/O CM
2 of 4 series · 16 of 46 positions shown, 18 images · non-contrast
Comparison: None.

CLINICAL DATA: Right-sided abdominal pain and hematuria starting on
[REDACTED]. Hypertension.

EXAM:
CT ABDOMEN AND PELVIS WITHOUT CONTRAST
TECHNIQUE: Multidetector CT imaging of the abdomen and pelvis was performed
following the standard protocol without IV contrast.

[Series 2: renal stone < 200 lbs 5.0 b31f · axial · 0.77mm/px · z∈[-409,-49]mm · 13 of 80 slices shown, 15 images]
[im 4/80  soft-tissue]
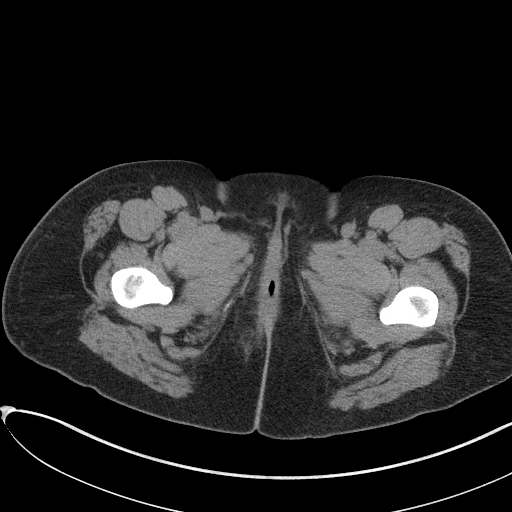
[im 4/80  bone]
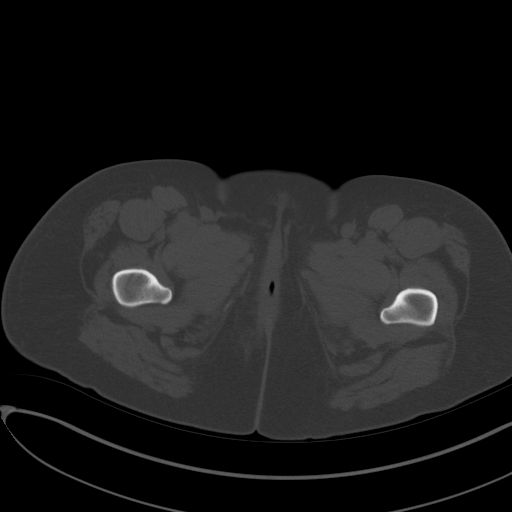
[im 10/80  soft-tissue]
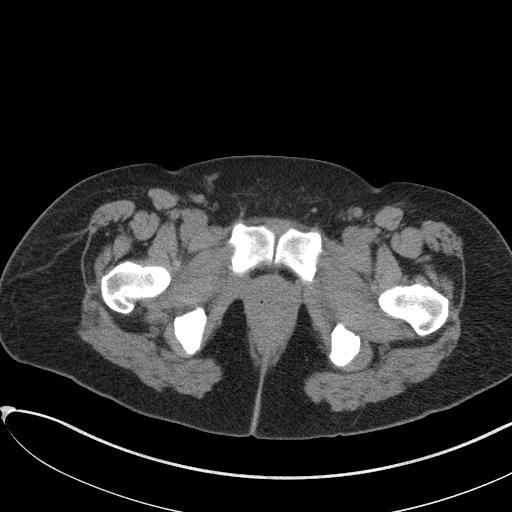
[im 16/80  soft-tissue]
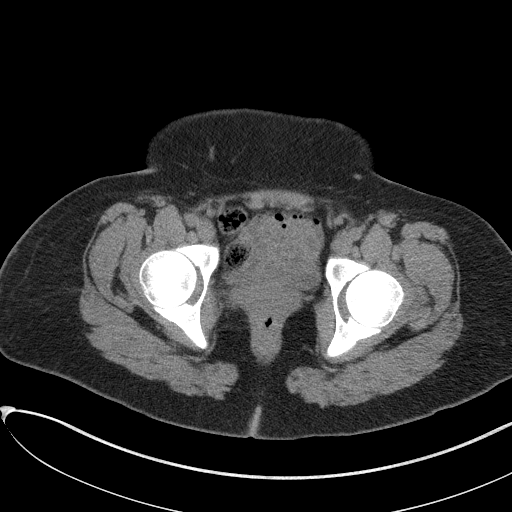
[im 22/80  soft-tissue]
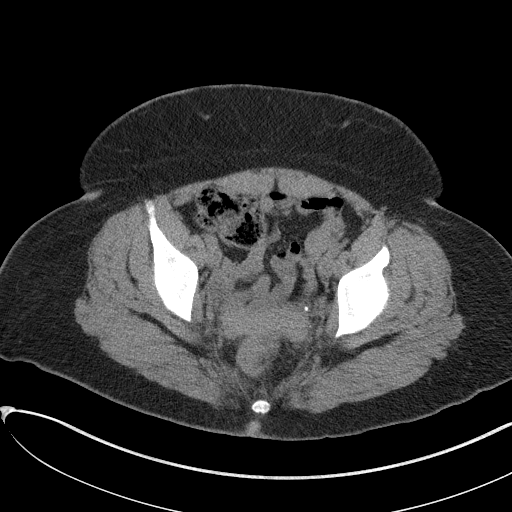
[im 28/80  soft-tissue]
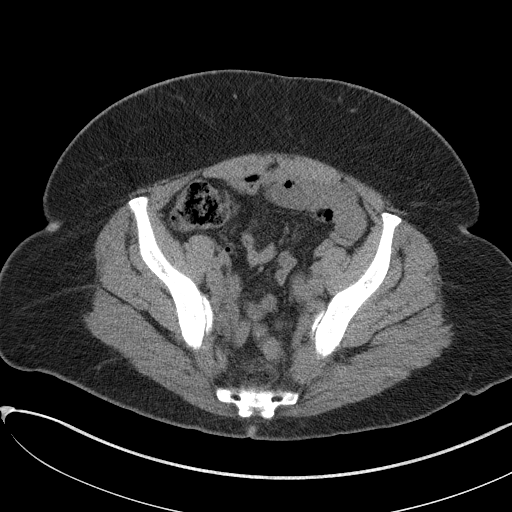
[im 34/80  soft-tissue]
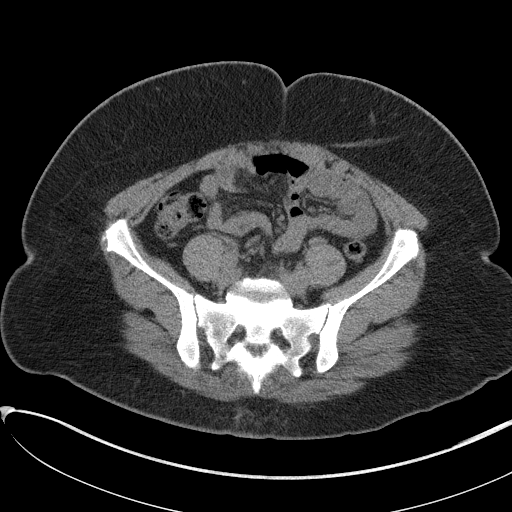
[im 40/80  soft-tissue]
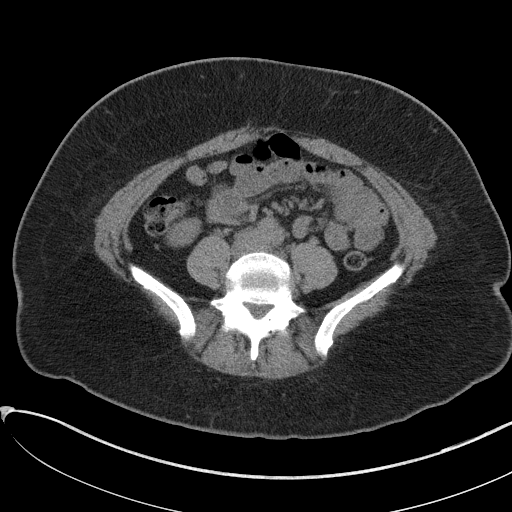
[im 46/80  soft-tissue]
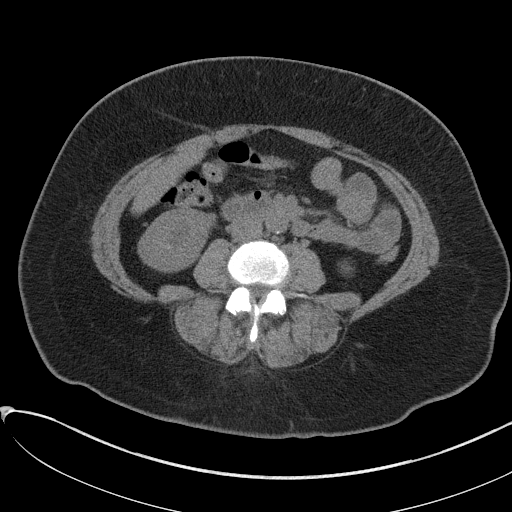
[im 52/80  soft-tissue]
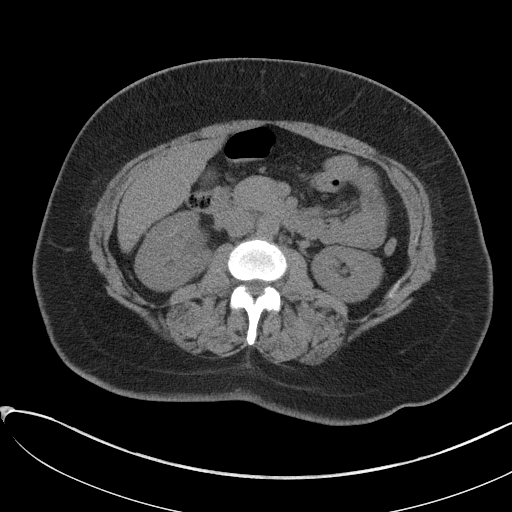
[im 52/80  bone]
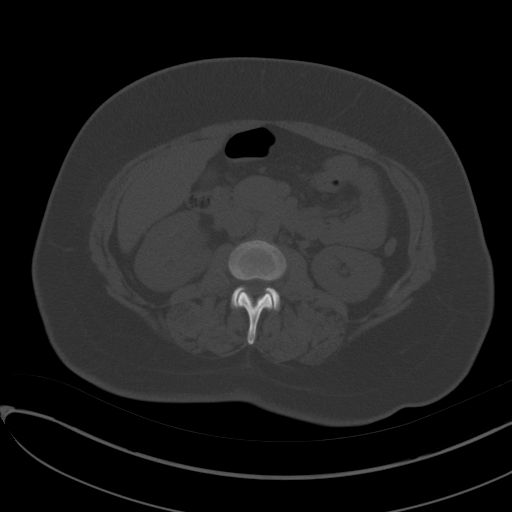
[im 58/80  soft-tissue]
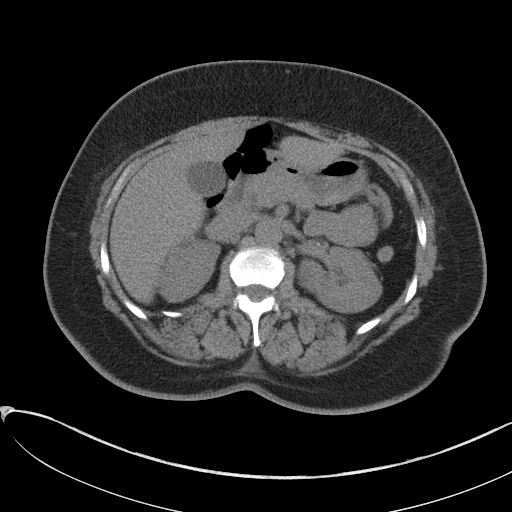
[im 64/80  soft-tissue]
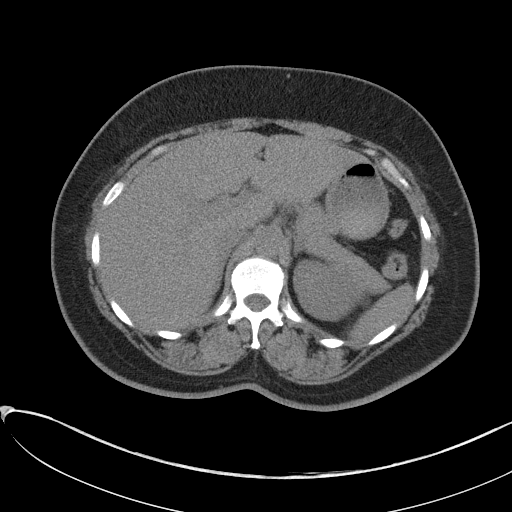
[im 70/80  soft-tissue]
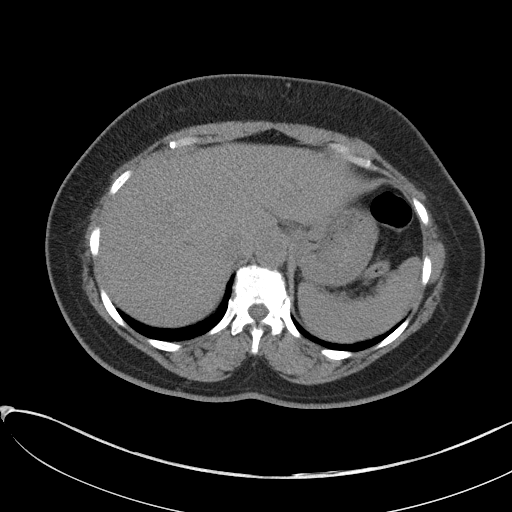
[im 76/80  soft-tissue]
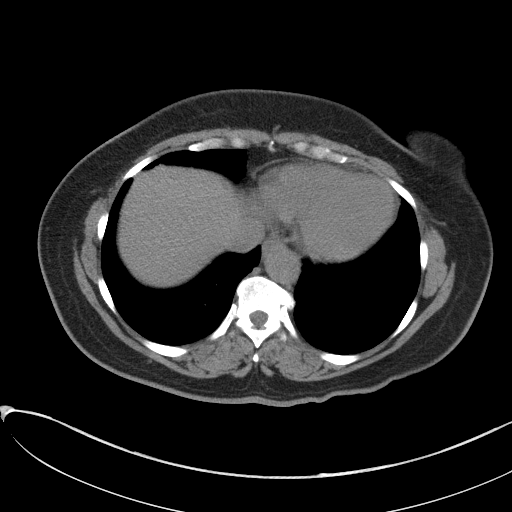

[Series 5: renal stone 3.0 coronal · coronal · 0.79mm/px · 3 of 87 slices shown]
[im 29/87  soft-tissue]
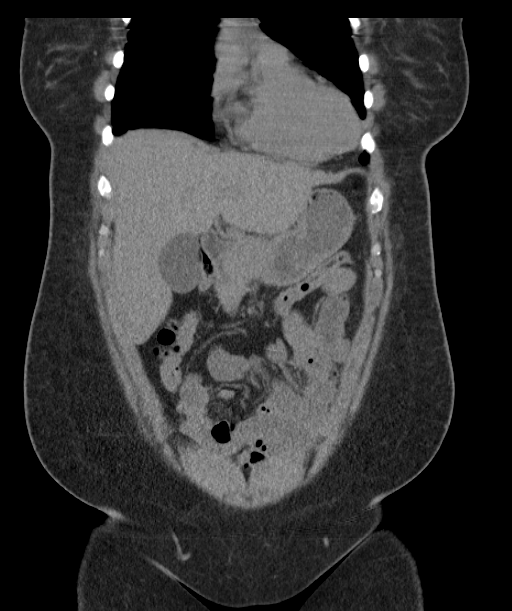
[im 39/87  soft-tissue]
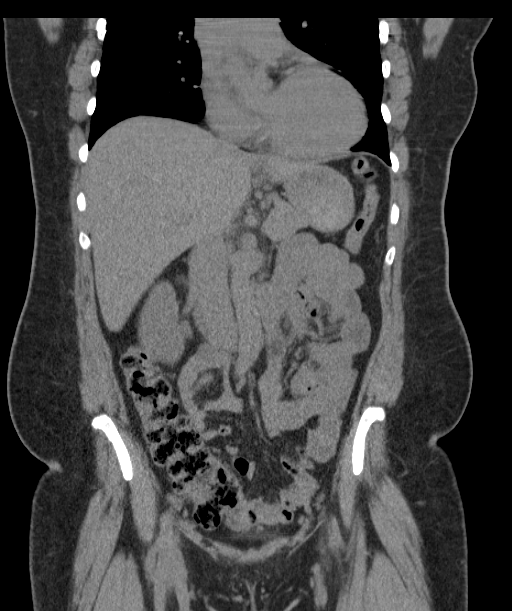
[im 48/87  soft-tissue]
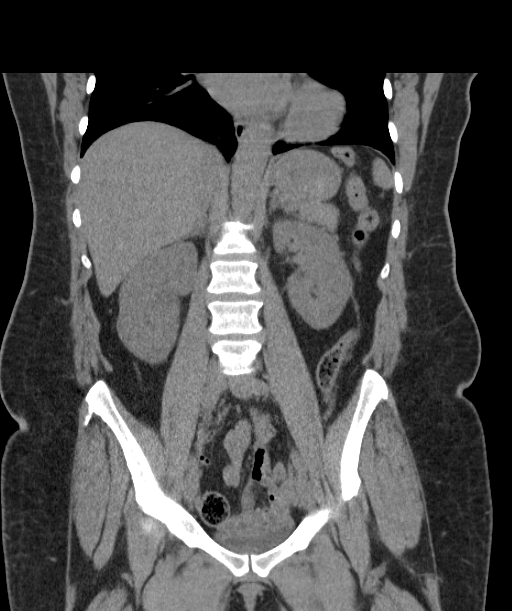

[16 of 46 positions shown; findings below may reference images not displayed]

FINDINGS: Lower chest:  Unremarkable

Hepatobiliary: Unremarkable

Pancreas: Unremarkable

Spleen: Unremarkable

Adrenals/Urinary Tract: Right hydronephrosis and proximal
hydroureter. Right proximal periureteral stranding. The ureter
extends adjacent to a normal-appearing appendix and adjacent to the
right ovary, and is highly indistinct in this vicinity. There is a 4
mm calcification just posterior to the right ovary which could be in
the right ureter. The ureter itself in this vicinity is highly
indistinct. The distal ureters difficult to visualize.

There is a 2 mm right kidney lower pole nonobstructive calculus.
There is a potential 1 mm left kidney lower pole nonobstructive
calculus. Fluid density 1.3 cm left mid kidney lesion, image 54
series 5.

Stomach/Bowel: Unremarkable

Vascular/Lymphatic: Aortoiliac atherosclerotic vascular disease.

Reproductive: The uterus is not well visualized. Has the patient had
a hysterectomy? I do not see a record of fat in the patient's
medical record. It is conceivable that a small retroverted uterus is
obscured by surrounding bowel. Correlate with direct questioning of
the patient on this issue.

1.6 cm hypodensity along the right ovary, probably a cyst.

Other: No supplemental non-categorized findings.

Musculoskeletal: Degenerative facet arthropathy bilaterally at L4-5
with vacuum gas phenomenon in the facet joints and grade 1
anterolisthesis. The disc uncovering and facet arthropathy result in
bilateral mild foraminal stenosis at this level.
IMPRESSION: 1. Right hydronephrosis and proximal hydroureter, probably due to a
4 mm stone in the pelvis. The distal ureter is highly indistinct and
accordingly it is difficult to no on today's noncontrast exam
whether this stone is in the ureter or not. However, it is the most
likely candidate for the right hydronephrosis.
2. Bilateral tiny punctate nonobstructive renal calculi.
3.  Aortoiliac atherosclerotic vascular disease.
4. Small cyst, right ovary.
5. Uterus not well visualized.  Has the patient had a hysterectomy?
6. Bilateral mild foraminal impingement at L4-5 due to spondylosis
and degenerative disc disease.

## 2017-03-16 ENCOUNTER — Ambulatory Visit: Payer: Self-pay | Admitting: Sports Medicine

## 2024-03-16 NOTE — Discharge Summary (Signed)
 [facility] Medicine Discharge Summary  Admit Date: 03/13/2024 Discharge Date: 03/24/2024  Admitting Physician: Maude Morene Conners, MD Discharge Physician: Venetia Silk, MD  Primary Care Provider: Michela Silvano SAUNDERS, MD, Phone [phone]  Discharge Destination: Home  Admission Diagnoses:  Confusion, unspecified [R41.0] Chest pain, unspecified type [R07.9] Hypertension, unspecified type [I10]  Discharge Diagnoses:  Principal Problem:   Herpes zoster encephalitis (HHS-HCC) Active Problems:   Hypertension   Hx of NSTEMI- Plavix+ASA 10/2016-10/2017, then ASA   Coronary artery disease involving native coronary artery of native heart without angina pectoris   Encephalopathy Resolved Problems:   * No resolved hospital problems. 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 Will also received boluses with her HH infusions. Weekly CBCd and BMP on Mondays preferably Fax Labs To: [facility], Re: [facility] (fax [phone]) Call For a Change in Clinical Status, Critical/Abnormal Results, or Order Verification: OPAT Pharmacist, ID Clinic (ph [phone] (triage), pager [phone])   Anticipatory Guidance for Outpatient Care:  ID follow-up arranged 04/10/24 NSU follow-up 11/11 for incidental basilar artery finding     Results Pending at Discharge:  None Please see phone numbers at end of this summary for lab contact information.   Follow-up/Care Transition Plan: Sched. appts: Future Appointments  Date Time Provider Department Center  04/10/2024  2:40 PM [place], [place] Server, MD INFECT [facility]  06/19/2024  8:20 AM CC CT 5 [facility]CT [facility]  06/19/2024  1:30 PM Phil Hasten, MD [facility]    Follow-up info: Michela Silvano SAUNDERS, MD [address] [facility]) [place] [address] [phone]  Follow up   [facility] [address] [place] [address]         Allergies/Intolerances:  Allergies  Allergen Reactions  . Pollen Extracts Other (See Comments)    Running nose, sinus infection, headaches, itchy eyes  . Lisinopril (Bulk) Cough     New Adverse Drug Events: none  Medications:     Current Discharge Medication List     START taking these medications      Instructions  acetaminophen  325 MG tablet Refills: 0 Stop taking on: March 26, 2024  Commonly known as: TYLENOL  Take 2 tablets (650 mg total) by mouth every 8 (eight) hours as needed for up to 10 days Last time this was given: 650 mg on March 23, 2024  8:00 PM       CONTINUE taking these medications      Instructions  albuterol 2.5 mg /3 mL (0.083 %) nebulizer solution Quantity: 75 mL Refills: 10  Commonly known as: PROVENTIL INHALE 1 VIAL VIA NEBULIZER EVERY 6 HOURS AS NEEDED FOR WHEEZING FOR UP TO 30 DAYS   VENTOLIN HFA 90 mcg/actuation inhaler Quantity: 18 g Refills: 2 Generic drug: albuterol MDI (PROVENTIL, VENTOLIN, PROAIR) HFA  INHALE 2 PUFFS BY MOUTH EVERY 6 HOURS AS NEEDED Doctor's comments: Please dispense the generic or branded medication that is covered by plan at lowest cost to patient. Needs appointment sometime in next couple of months   aspirin 81 MG EC tablet Quantity: 30 tablet Refills: 12  1 PO daily, Call [phone] for an appt, last visit was over a year ago. Last time this was given:  81 mg on March 23, 2024  9:30 AM   atorvastatin 10 MG tablet Quantity: 30 tablet Refills: 0  Commonly known as: LIPITOR TAKE 1 TABLET BY MOUTH DAILY *PATIENT NEEDS APPOINTMENT* Last time this was given: 10 mg on March 23, 2024  9:30 AM   baclofen 5 mg tablet Refills: 0  Commonly known as: LIORESAL    carvediloL 12.5 MG tablet Quantity: 60 tablet Refills: 0  Commonly known as: COREG Take 1 tablet (12.5 mg total) by mouth 2 (two) times daily with meals Doctor's comments: Has been >1 yr since last appt Last time this was given: 6.25 mg on March 23, 2024  5:57 PM   cetirizine 10 MG tablet Quantity: 90 tablet Refills: 3  Commonly known as: ZyrTEC Take 1 tablet (10 mg total) by mouth once daily   losartan-hydroCHLOROthiazide  50-12.5 mg tablet Quantity: 30 tablet Refills: 1  Commonly known as: HYZAAR Take 1 tablet by mouth once daily Call [phone] for an appt, last visit was over a year ago.  Mention appt need is URGENT Doctor's comments: ERX. REQUEST REPLACED. NEW E-RX QUEUED. LILI WIR:[phone]   meloxicam 15 MG tablet Quantity: 30 tablet Refills: 3  Commonly known as: MOBIC Take 1 tablet (15 mg total) by mouth once daily   spironolactone 25 MG tablet Refills: 0  Commonly known as: ALDACTONE Take 1 tablet (25 mg total) by mouth once daily Last time this was given: 25 mg on March 23, 2024  9:31 AM       STOP taking these medications    predniSONE 10 MG tablet Commonly known as: DELTASONE         Brief History of Present Illness:  From the admission H&P: Penny Washington is a 63 y.o. female with hypertension, CAD and prior NSTEMI (2018) on ASA./statin who presented to urgent care for evaluation of headache present for three days and was referred to the emergency department for the evaluation of CVA and ACS.   History obtained from the patient was limited due to encephalopathy.  The interview was augmented by chart review.   Patient presented to urgent care for evaluation of headache for 3 days, she thought that this was caused by her grandchildren braiding her hair which has happened in the past.  She did also report indigestion and nausea at urgent care.  Patient's sister was present in urgent care and reported the patient had been not acting  herself over the past 2 to 3 days   Patient was brought in by EMS from the urgent care reported a 10 out of 10 headache on arrival.  She was hypertensive with a maximum blood pressure reading of 212/112 she also again reported indigestion and epigastric discomfort serial troponins were stable.  She was given intravenous labetalol with improvement of her blood pressure.   Due to the patient's family reporting the 2 preceding days of behavior changes, she underwent a CTA and later an MRI to evaluate for structural causes of encephalopathy and headache.  An MRI was obtained and the preliminary interpretation showed no intracranial no acute intracranial abnormalities but did show chronic microvascular disease with mild global volume loss.  Consultation with neurosurgery resulted in a plan to keep systolic blood pressure below 839, minimizing the use of sedating medications and defer disposition to the emergency department.  The remainder of her laboratory evaluation was significant for mild hyponatremia, hypokalemia and a mild leukocytosis at 9.9 thousand.  Urinalysis was cloudy had trace protein trace blood positive  nitrates and red blood cells were present.  EKG showed sinus rhythm with Q waves in the inferior leads. _____________________   Hospital Course by Problem:  # VZV encephalitis  # Persistent headaches, improved  # Hypertensive emergency with encephalopathy, resolved  The patient presented with BP 212/112 with headache and AMS. CT/MRI brain showed chronic hypertensive changes without any acute findings or bleed. 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 EEG done showing diffuse slowing consistent with encephalopathy. Subsequently noted recent significant emotional stress and family reports some possible missed medication doses. She was initially treated with nicardipine gtt and resumed on home coreg, losartan, HCTZ,  and spironolactone. Subsequent downtrend in BP in the 120s/80s on home meds. Re-imaging the evening of 8/8 stable. Had ongoing symptoms. Neurology was consulted and she underwent LP on 8/11 which revealed elevated opening pressure and ultimately positive for VZV. Diagnosis/presentation consistent with VZV encephalitis per neurology. She was started on IV acyclovir  and reports greatly improved headaches (still intermittent) and resolved encephalopathy days leading up to discharge. PICC placed 8/12. ID consulted, recommended IV acyclovir  10 mg/kg q8 hours x 21 days (EOT 04/09/24). ID follow-up arranged 04/10/24. Patient understands she needs to push good oral hydration during her IV acyclovir  therapy - HH infusion requesting boluses ordered with IV acyclovir , so ordered 250 cc IVF with each dose. Discharged to daughter's home due to concerns of pt's own home/living conditions (black mold, multiple animals in home unable to be cared for while ill).    # Recurrent headaches secondary to above - improving  Headache history slightly unclear, but pt seems to have a history of intermittent headaches. She presented with severe headache unlike prior headaches. Initially suspected hypertensive emergency as a cause, but headaches persisted despite BP control. 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 Headaches improved with IV acyclovir .  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 Neurosurgery consulted on admit, do not feel this is contributing to her current presentation. Recommend BP management per above. Neurosurgery has requested follow-up and interval re-imaging with their clinic in 3 months, scheduled for 06/19/24.    # AKI, improved B/l Cr 0.9-1.0. Likely related to hypertensive disease. Bumped up to 1.4. Improved back down to 1.1 during last couple days up to discharge. Discussed  aggressive PO hydration while on IV acyclovir , encouraging PO fluids and have ordered boluses with HH IV infusions as above.    # H/o NSTEMI s/p PCI to LAD (10/2016) # CAD Last TTE following NSTEMI EF 50% with severe LVH with no repeats in system. No evidence of ACS or HF.  Continue home ASA and atorvastatin.   # Hypokalemia, resolved   Surgeries and Procedures Performed:  None  _____________________  Discharge Exam:  BP 135/81 (BP Location: Left upper arm, Patient Position: Lying)   Pulse 79   Temp 36.8 C (98.2 F) (Oral)   Resp 17   Wt 93.1 kg (205 lb 4 oz)   LMP  (LMP Unknown)   SpO2 94%   BMI 40.62 kg/m    Gen: AOx3, NAD, well appearing, laying in bed HEENT: some mild R eye ptosis (chronic), no facial droop otherwise Neck: soft, NT CV: normal rate, regular rhythm, normal S1/S2, no m/r/g Resp: CTAB, no wheezes or crackles Abd: soft, NT/ND, no HSM, NABS Skin: no rash Extr: no  peripheral edema, RUE PICC c/d/I  Neuro: no gross focal deficits, AAO x3   Pertinent Lab Testing: Recent Labs  Lab 03/21/24 0424 03/23/24 0424 03/24/24 0456  NA 139 139 138  K 3.8 3.7 3.9  CL 105 106 107  CO2 25 26 25   BUN 19 15 14   CREATININE 1.1* 1.0 1.1*  GLUCOSE 121 96 101  CALCIUM 9.1 9.1 8.9   No results for input(s): AST, ALT, ALKPHOS, TBILI in the last 168 hours.   Recent Labs  Lab 03/21/24 0424 03/23/24 0424 03/24/24 0456  WBC 10.3* 10.2* 10.5*  HGB 12.2 11.9 11.5*  HCT 38.2 37.3 36.3  PLT 344 349 352   No results for input(s): APTT, INR in the last 168 hours.    Other Pertinent Labs:    Latest Reference Range & Units 03/14/24 04:50 03/14/24 05:31  Acetaminophen  Level 10 - 25 mcg/mL  <10 (L)  Ethanol <5 mg/dL  <5  Salicylate 50 - 200 mg/L  <40 (L)  Barbiturates, Urine Negative  Negative   Benzodiazepine, Urine Negative  Negative   Cocaine Metabolites, Ur Negative  Negative   Opiates, Urine Negative  Negative   Tetrahydrocannabinol (THC), Urine  Negative  Negative   Amphetamine/Methamphetamine, Urine Negative  Negative   Methadone, Urine Negative  Negative   Oxycodone , Urine Negative  Negative   (L): Data is abnormally low   Latest Reference Range & Units 03/14/24 04:51 03/14/24 05:31  T. Pallidum Antibody (Syphilis) Negative   Negative  Respiratory Pathogen, Extended Panel, PCR  Rpt   Bordetella pertussis -  Not Detected   Bordetella parapertussis -  Not Detected   Chlamydia pneumoniae -  Not Detected   Mycoplasma pneumoniae -  Not Detected   Influenza A RNA -  Not Detected   Influenza B RNA -  Not Detected   Respiratory Syncytial Virus (RSV) RNA -   Not Detected   Parainfluenza 1 RNA -  Not Detected   Parainfluenza 2 RNA -  Not Detected   Parainfluenza 3 RNA -  Not Detected   Parainfluenza 4 RNA -  Not Detected   Human Metapneumovirus RNA -  Not Detected   Adenovirus DNA -  Not Detected   Human Rhinovirus/Enterovirus RNA -  Not Detected   Coronavirus 229E -  Not Detected   Coronavirus HKU1 -  Not Detected   Coronavirus NL63 -  Not Detected   Coronavirus OC43 -  Not Detected   Coronavirus (COVID-19) SARS-CoV-2 PCR -  Not Detected   HIV Screen NonReactive   NonReactive  HIV-1 And HIV-2 Antibody & Antigen   Rpt  Rpt: View report in Results Review for more information  E RVP--negative Syphilis screen--negative HIV--negative          Lab 03/19/24 1554  CSFCOLOR Colorless  Colorless  CSFCLAR Clear  Clear  GLUCCSF 56  PROTEINCSF 172*  CSFRBC 38*  127*  CSFNUC 561*  467*  CSFNEUT 2  2  CSFLYMPH 85*  90*  CSFMONO 7*  3*  CSFMAC 1  2    + CSF VZV   Pertinent Imaging:   8/5 CT head angiogram with and without IMPRESSION: 1. No evidence of intracranial vessel flow limiting stenosis. 2. 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 At minimum, follow-up CTA study in 6 months is recommended. Consider further clinical follow-up.. 3. Small vessel disease changes and  age-appropriate parenchymal volume loss...   8/6 MRI brain with  and without contrast IMPRESSION:   -Several foci of hemosiderin deposition throughout the brain as could be seen with chronic microhemorrhages. No definite MRI criteria for acute intracranial hemorrhage.   -Generalized parenchymal volume loss, ventriculomegaly, and advanced white matter changes. These could be due to chronic small vessel ischemic disease, postinfectious or inflammatory etiology as well as a demyelinating process. _____________________  Code Status: Full Code Goals of care were not addressed during this admission.   Status on Discharge:  Current activity: Walks occasionally (03/23/24 2000) Current mobility: No limitation (03/23/24 2000)  Activity Recommendation: activity as tolerated  Other Discharge Instructions: Services setup at discharge: Home infusion for IV acyclovir  Tubes/lines at discharge: PICC line   If applicable for PICC/line care:  1. Pull PICC at end of IV antibiotic therapy  2. PICC (or other line) care per protocol. SASH flushing protocol - 10 mL normal saline flush pre- and post-medication infusion and 5 mL of 100 unit/mL heparin as final flush Change dressing weekly and as needed. Pharmacy to dispense flush qty w/ refills as needed to maintain IV access as necessary during therapy and until IV access is discontinued.  Dispense IV dressing and supplies for therapy administration.  3. If venipuncture cannot be done and the PICC line needs to be used, then flush the line with 10 mL sterile normal saline, then heparin.     Diet: Diet regular  Wound Care Order Instructions     None       _____________________  Time spent on discharge process: 45 minutes    MERCER CLINE, MD [facility]  03/24/2024   Hospital Contact Information:  [facility]) [facility])  Pending tests:  Laboratory: [phone] Microbiology:  [phone] Pathology: [phone] Radiology: [phone]  General questions: [phone] Pending tests: Laboratory: [phone] Microbiology: [phone] Pathology: [phone] Radiology: [phone]  General questions:  [phone] Pending tests:  Laboratory: [phone] Microbiology: [phone] Pathology: [phone] Radiology: [phone]  General questions:  [phone]

## 2024-03-23 NOTE — Progress Notes (Signed)
 Infectious Diseases Progress Note   Hospital Day: 11 with principal problem of Herpes zoster encephalitis (HHS-HCC)  Subjective:   Interval History: Ms. Penny Washington reports she is still having bifrontal headaches.  She states that her headaches are continuing to improving.  She has not had any visual changes.  She denies any new periods of confusion, localized weakness or numbness.  She states that overall she feels significantly better with improved appetite and energy.  She denies any cough, shortness of breath, chest pain, nausea, vomiting, abdominal pain or dysuria.  Antibiotics: Acyclovir `  5/21  Scheduled Medications:  . acyclovir  (ZOVIRAX ) IV  10 mg/kg Intravenous Q8H  . aspirin  81 mg Oral Daily  . atorvastatin  10 mg Oral Daily  . carvediloL  6.25 mg Oral BID CC  . enoxaparin  40 mg Subcutaneous Daily  . hydroCHLOROthiazide   12.5 mg Oral Daily  . losartan  50 mg Oral Daily  . polyethylene glycol  17 g Oral BID  . spironolactone  25 mg Oral Daily    Objective:   Vital signs in last 24 hours: Temp:  [36.4 C (97.6 F)-37 C (98.6 F)] 36.7 C (98 F) Heart Rate:  [69-86] 86 Resp:  [16-18] 18 BP: (110-145)/(60-86) 113/76 Temp (24hrs), Avg:36.7 C (98.1 F), Min:36.4 C (97.6 F), Max:37 C (98.6 F)  SpO2: 96 % Weight: 93.1 kg (205 lb 4 oz) I/O last 3 completed shifts: In: 360 [P.O.:360] Out: -   Physical Exam: General: Pleasant in no acute distress Eyes: Anicteric HENT: Posterior pharynx clear without oral candidiasis or oral leukoplakia Neck: Supple without lymphadenopathy Respiratory: Clear to auscultation anteriorly Abdomen: Soft, nontender, nondistended Extremities: no edema or evidence of peripheral emboli IV site(s):   No erythema, warmth or swelling.  No drainage at the exit site. Neuro: Cranial nerves II through XII grossly intact.  Motor strength 4-5/5 bilaterally equal.  Lines: Patient Lines/Drains/Airways Status     Active Lines,Drains,Airways      Name Placement date Placement time Site Days   PICC Single Lumen Non-Tunneled/Temporary 03/20/24 Right Basilic 03/20/24  1554  Basilic  less than 1            Laboratory Studies:  Microbiology:  Date  Site  Gram stain  Result   No new micro data  Other labs:  Recent Labs  Lab 03/20/24 0430 03/21/24 0424 03/23/24 0424  NA 140 139 139  K 4.5 3.8 3.7  CL 104 105 106  CO2 27 25 26   BUN 20 19 15   CREATININE 1.2* 1.1* 1.0  GLUCOSE 108 121 96    Recent Labs  Lab 03/18/24 0507 03/21/24 0424 03/23/24 0424  WBC 12.4* 10.3* 10.2*  HGB 13.4 12.2 11.9  HCT 42.4 38.2 37.3  PLT 347 344 349       Assessment/Plan:   Principal Problem:   Herpes zoster encephalitis (HHS-HCC) Active Problems:   Hypertension   Hx of NSTEMI- Plavix+ASA 10/2016-10/2017, then ASA   Coronary artery disease involving native coronary artery of native heart without angina pectoris   Encephalopathy   1.  Varicella encephalitis Patient clinically improving.  Her significant other at the bedside reports that she is not have a confusion.  He believes that her thinking is back to baseline.  She still having intermittent bifrontal headaches but she states that these are clearly improving.  Fortunately she is tolerating her acyclovir  well.  Today is day 3 of 21 of therapy.  We discussed side effects of medication primarily being renal.  She was advised if she develops any GI side effects to let me know.  She was advised importance of being well-hydrated while on IV acyclovir .  She has a midline catheter in place.  She would be a candidate for discharge in the near future.  Disposition per admitting team.  Please see OPAT note for outpatient IV acyclovir  recommendations.  Recommendations: 1.  Continue IV acyclovir  10 mg/kg for 21 days 2.  Patient was advised the importance of good hydration 3.  See OPAT note for outpatient antibiotic recommendations 4.  Patient to follow-up in the ID clinic on April 10, 2024 at 2:40 PM.  She will be contacted regarding the date time and location of that appointment.  I have little else to at this time and will sign off.  Please do not hesitate to contact me if additional infectious disease questions or issues arise.  Thank you for allowing me to see Penny Washington.  Please do not hesitate to contact [facility] Infectious Disease team 2 using pager number [phone] if additional infectious disease questions or issues arise.   [place] Edsel Hull, MD 03/23/2024

## 2024-03-23 NOTE — Progress Notes (Signed)
 Pharmacy recommending a reduction in dose of IV acyclovir  to be based on ideal body weight at 10 mg/kg dosing.  Have adjusted treatment plan IV acyclovir  to 500 mg IV every 8 hours.  No change in stop date.  Rosalynn Hull, MD 03/23/2024

## 2024-03-23 NOTE — Progress Notes (Addendum)
 Hospital Medicine Attending Progress Note  Hospital Day: 11 with principal problem of Herpes zoster encephalitis (HHS-HCC)    Subjective:   - NAEO - Dose of IV acyclovir  adjusted per pharmacy and ID. Updated HH orders. CM updated HH company.  - Doing well, headaches improving, plan for d/c tomorrow  - FMLA papers filled out for pt   Objective:     Current Vital Signs 24h Vital Sign Ranges  T 36.9 C (98.4 F) (03/23/24 0430) Temp  Avg: 36.8 C (98.2 F)  Min: 36.4 C (97.6 F)  Max: 37 C (98.6 F)  BP (!) 145/86 (03/23/24 0430) BP  Min: 110/79  Max: 145/86  HR 86 (03/23/24 0430) Pulse  Avg: 76  Min: 69  Max: 86  RR 17 (03/23/24 0430) Resp  Avg: 17.3  Min: 16  Max: 18  O2sat 97 %   SpO2  Avg: 95.7 %  Min: 93 %  Max: 97 %       Current Shift Ins/Outs 24h Total Ins/Outs  Time Ins Outs No intake/output data recorded. 08/14 0701 - 08/15 0700 In: 120 [P.O.:120] Out: -        Weights   First 85 kg (187 lb 6.3 oz) (03/14/24 1007)   Last 93.1 kg (205 lb 4 oz) (03/19/24 2110)    Gen: AOx3, NAD, well appearing, laying in bed HEENT: some mild R eye ptosis (chronic), no facial droop otherwise Neck: soft, NT CV: normal rate, regular rhythm, normal S1/S2, no m/r/g Resp: CTAB, no wheezes or crackles Abd: soft, NT/ND, no HSM, NABS Skin: no rash Extr: no peripheral edema Neuro: no gross focal deficits, AAO x3    Scheduled Medications   acyclovir  (ZOVIRAX ) IV, 10 mg/kg (Order-Specific), Intravenous, Q8H aspirin, 81 mg, Oral, Daily atorvastatin, 10 mg, Oral, Daily carvediloL, 6.25 mg, Oral, BID CC enoxaparin, 40 mg, Subcutaneous, Daily hydroCHLOROthiazide , 12.5 mg, Oral, Daily losartan, 50 mg, Oral, Daily polyethylene glycol, 17 g, Oral, BID spironolactone, 25 mg, Oral, Daily     Infusions        PRN Meds   acetaminophen , 650 mg, Oral, Q4H PRN albuterol MDI (PROVENTIL, VENTOLIN, PROAIR) HFA, 2 Puff, Inhalation, Q6H PRN lidocaine , 0.5 mL, Subcutaneous, As  Directed lidocaine , 3 mL, Subcutaneous, As Directed promethazine, 12.5 mg, Intravenous, Q6H PRN  Or promethazine, 12.5 mg, Oral, Q6H PRN        Selected Labs and Data: Recent Labs  Lab 03/20/24 0430 03/21/24 0424 03/23/24 0424  NA 140 139 139  K 4.5 3.8 3.7  CL 104 105 106  CO2 27 25 26   BUN 20 19 15   CREATININE 1.2* 1.1* 1.0  GLUCOSE 108 121 96  CALCIUM 9.3 9.1 9.1   No results for input(s): AST, ALT, ALKPHOS, TBILI in the last 168 hours.   Recent Labs  Lab 03/18/24 0507 03/21/24 0424 03/23/24 0424  WBC 12.4* 10.3* 10.2*  HGB 13.4 12.2 11.9  HCT 42.4 38.2 37.3  PLT 347 344 349   No results for input(s): APTT, INR in the last 168 hours.    Microbiology:   E RVP--negative Syphilis screen--negative HIV--negative     Lab 03/19/24 1554  CSFCOLOR Colorless  Colorless  CSFCLAR Clear  Clear  GLUCCSF 56  PROTEINCSF 172*  CSFRBC 38*  127*  CSFNUC 561*  467*  CSFNEUT 2  2  CSFLYMPH 85*  90*  CSFMONO 7*  3*  CSFMAC 1  2   + CSF VZV   Pertinent Imaging:  8/5 CT head angiogram with  and without IMPRESSION: 1. No evidence of intracranial vessel flow limiting stenosis. 2. 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 At minimum, follow-up CTA study in 6 months is recommended. Consider further clinical follow-up.. 3. Small vessel disease changes and age-appropriate parenchymal volume loss...  8/6 MRI brain with and without contrast IMPRESSION:   -Several foci of hemosiderin deposition throughout the brain as could be seen with chronic microhemorrhages. No definite MRI criteria for acute intracranial hemorrhage.   -Generalized parenchymal volume loss, ventriculomegaly, and advanced white matter changes. These could be due to chronic small vessel ischemic disease, postinfectious or inflammatory etiology as well as a demyelinating process.    Assessment and Plan   Penny Washington is a 63 y.o. female who  was admitted for the following problems: Principal Problem:   Herpes zoster encephalitis (HHS-HCC) Active Problems:   Hypertension   Hx of NSTEMI- Plavix+ASA 10/2016-10/2017, then ASA   Coronary artery disease involving native coronary artery of native heart without angina pectoris   Encephalopathy  # VZV encephalitis  # Persistent headaches, improved  # Hypertensive emergency with encephalopathy, resolved  The patient presented with BP 212/112 with headache and AMS. CT/MRI brain showed chronic hypertensive changes without any acute findings or bleed. 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 EEG done showing diffuse slowing consistent with encephalopathy. Subsequently noted recent significant emotional stress and family reports some possible missed medication doses. She was initially treated with nicardipine gtt and resumed on home coreg, losartan, HCTZ, and spironolactone. Subsequent downtrend in BP in the 120s/80s. Re-imaging the evening of 8/8 stable. Had ongoing symptoms. Neurology was consulted and she underwent LP on 8/11 which revealed elevated opening pressure and ultimately positive for VZV. Diagnosis/presentation consistent with VZV encephalitis. She was started on IV acyclovir  and reports improved headaches (still persistent though mild) and resolved encephalopathy per family members.  - PICC placed 8/12  - Appreciate neurology and ID-- planning for IV acyclovir  10 mg/kg every 8 hours x 21 days (EOT 04/09/24). ID follow-up arranged 04/10/24.  - Patient understands she needs to push good oral hydration during her IV acyclovir  therapy - HH infusion requesting boluses ordered with IV acyclovir , so ordered 250 cc IVF with each dose  - [facility] teaching completed 8/14. Plan to discharge to daughter's home tomorrow Saturday 8/16 earliest that pt is able to stay with her daughter, due to concerns of pt's own home/living  conditions  - Continue home HTN meds: carvedilol, HCTZ, losartan, and spironolactone    # Recurrent headaches likely secondary to above - improving  Headache history slightly unclear, but pt seems to have a history of intermittent headaches. She presented with severe headache unlike prior headaches. Initially suspected hypertensive emergency as a cause, but headaches persisted despite BP control. 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 - Treat VZV encephalitis as per above  -Per neuro first line treatment for HA if it recurs:  compazine+benadryl+toradol  + IVF     -Avoid narcotics due to risk of transformed migraine and analgesic rebound headache. Other options include:  - Consider 4 mg IV dexamethasone (Decadron). - Consider IV valproic acid (1,000 mg in 50 cc of NS administered over 5 minutes). - Consisder IV Magnesium sulfate 2g.   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 Neurosurgery consulted on admit, do not feel this is contributing to her current presentation. Recommend BP management per above. - Neurosurgery has requested follow-up and interval re-imaging with their clinic in 3 months (already arranged)   # AKI, improved Cr at 1.1 from baseline of 0.8. Likely related to hypertensive disease. Improved to 1.0 but then bumped up to 1.4. Now trending back down to 1.1 on 8/13.  - Super important to be well-hydrated on IV acyclovir , encouraging PO fluids and have ordered boluses with HH IV infusions    # H/o NSTEMI s/p PCI to LAD (10/2016) # CAD  Last TTE following NSTEMI EF 50% with severe LVH with no repeats in system. No evidence of ACS or HF.  - Continue home ASA and atorvastatin   # Hypokalemia Replete as needed  # VTE Prophylaxis: low molecular weight heparin  # Code Status: Full Code   Discharge Planning: Expected Discharge Date: 03/25/2024   Current Activity: Walks occasionally (03/22/24 2006) Current Mobility: No limitation (03/22/24 2006) Anticipated Discharge Destination: Home Anticipated Discharge Needs: home health PT DC recommendation: Home (03/19/24 1108) Follow-up appointments scheduled:    Future Appointments  Date Time Provider Department Center  03/23/2024  1:00 PM Saintclair Mackie, PA [facility]  04/10/2024  2:40 PM Hendershot, Dallas Server, MD INFECT DIS [facility]  06/19/2024  8:20 AM CC CT 5 [facility]CT [facility]  06/19/2024  1:30 PM Phil Hasten, MD [facility]      Attestation Statement:   I personally performed the service. (TP)  MERCER CLINE, MD    MERCER CLINE, MD [facility] Medicine [facility]

## 2024-03-23 NOTE — Progress Notes (Addendum)
 Case Manager Discharge Summary / Closing Note  Expected Discharge Date & Time: 03/24/2024 at   Discharge Plan:  The patient and patient representative(s) have been involved in the development of this plan and are in agreement.    Post-Acute Services Coordinated: Dialysis/Infusion - Admitted Since 03/13/2024     Service Provider Services Address Phone Fax Patient Preferred   [facility] ([facility]  Infusion and IV Therapy [address] [place], [place] [address] [phone] --       Infusion Documentation    Flowsheet Row Most Recent Value  Agency Info / Branch Phone [phone] [facility])  Start of Care 03/24/24  Medication delivery date and location saturday  Medications Arranged IV abx acyclovir   Teachable Caregiver name & phone number Patient and daughter Arther [phone])  Follow Up Provider see order  Information Provided This agency has Epic / Medlink access and has agreed to retrieve the order and necessary supporting documentation independently    [facility] will provide [facility] agency nurse with [facility].  Transportation: Arrangements: patient arranged Service Arranged: private vehicle  Final ADT:  Final ADT Discharge Disposition: Home Based  Home Based: Home Health (HH RN, PT, OT - NOT Private Duty Nursing) ([facility] will provide [facility] nurse)                 Second IMM Received: Provided to patient - in person (03/17/24 9277) 03/23/24 at 1641 CM sent consult to [facility] for delivery.   Final Summary: Plan for home with IV abx. Daughter and patient in agreement with [facility] for IV abx which they will provide [facility] agency with [facility]. Daughter will be picking up patient around 3-4 pm on 03/24/24.   Tillman Daring MHA, BSN-RN, ACM Case Manager Case Management [facility]    [place]

## 2024-03-23 NOTE — Progress Notes (Signed)
 Infectious Disease OPAT Treatment Plan Note   Patient is being treated for infection type: CNS infection. Patient Type: general  Infection of Implanted Material: no,   Pathogens: Varicella   Fax Labs To: [facility], Re: OPAT (fax [phone]) Call For a Change in Clinical Status, Critical/Abnormal Results, or Order Verification: OPAT Pharmacist, ID Clinic (ph [phone] (triage), pager [phone])   OPAT Abx will be monitored by OPAT Monitoring Provider: Selinda Southward, PharmD/Jenna Januszka, PharmD/Tyler Pitcock, PharmD Responsible ID Attending Until ID Follow-up:: [place]    Flowsheet Row Antimicrobials  Parenteral Antimicrobials   Acyclovir  Dose (mg) 500 mg  Acyclovir  Frequency Every 8 hours  Acyclovir  Start Date 03/19/24  Acyclovir  End Date 04/09/24  PO Antimicrobials     Requested Labs: Frequency Lab Test Additional Information  Weekly Labs: CBC with diff, BMP Specific Day: Monday (preferred)  Twice Weekly      Other    Doses are current as of 03/23/2024. Please check with pharmacy for changes in dosing prior to discharge.  If patient is discharging home, please use [facility] for antibiotics if possible  If applicable for PICC/line care:  1. Pull PICC at end of IV antibiotic therapy  2. PICC (or other line) care per protocol. SASH flushing protocol - 10 mL normal saline flush pre- and post-medication infusion and 5 mL of 100 unit/mL heparin as final flush Change dressing weekly and as needed. Pharmacy to dispense flush qty w/ refills as needed to maintain IV access as necessary during therapy and until IV access is discontinued.  Dispense IV dressing and supplies for therapy administration.  3. If venipuncture cannot be done and the PICC line needs to be used, then flush the line with 10 mL sterile normal saline, then heparin.    Plan for Transition to Oral Antibiotics: no Additional Imaging Required: no         ID follow-up required: ID Follow Up Required: yes

## 2024-03-24 NOTE — Care Plan (Signed)
  Problem: All patients at High risk for falls Goal: Patient will remain free of falls. Outcome: Progressing   Problem: Gait Alteration Goal: Patient will mobilize safely without falling Outcome: Progressing   Problem: Cognitive/Mental or Behavior Alteration Goal: Patient will mobilize safely without falling Outcome: Progressing   Problem: Medications Goal: Patient will mobilize safely without falling Outcome: Progressing   Problem: Knowledge deficit: Goal: Patient/caregiver ability to state and carry out methods to decrease the pain will improve Description:      Outcome: Progressing Note: Patient satisfied with pain goal. Pain Intervention(s): Medication (Non opioid given)  Goal: Patient/caregiver ability to identify factors that increase the pain will improve Outcome: Progressing Note: Patient satisfied with pain goal. Pain Intervention(s): Medication (Non opioid given)    Problem: Alteration in comfort: Goal: Patient/caregiver's expressions of having a comfortable level of knowledge regarding medication regimen will improve Outcome: Progressing Note: Patient satisfied with pain goal. Pain Intervention(s): Medication (Non opioid given)    Problem: Altered ability to manage pain: Goal: Ability to develop a pain control plan will improve Outcome: Progressing Note: Patient satisfied with pain goal. Pain Intervention(s): Medication (Non opioid given)  Goal: Pain level will decrease Outcome: Progressing Note: Patient satisfied with pain goal. Pain Intervention(s): Medication (Non opioid given)  Goal: Satisfaction with pain management regimen will improve Outcome: Progressing Note: Patient satisfied with pain goal. Pain Intervention(s): Medication (Non opioid given)  Goal: Patient's ability to cope will be supported Outcome: Progressing Note: Patient satisfied with pain goal. Pain Intervention(s): Medication (Non opioid given)    Problem: All patients  positive for delirium Goal: Patient will remain safe Outcome: Progressing   Problem: Disorientation Goal: The patient will be oriented to person, time, or place in the environment Outcome: Progressing   Problem: Inappropriate Behavior Goal: The patient will have behavior appropriate to place and/or for the person e.g. pulling at tubes or dressings, attempting to get out of bed when that is contraindicated and the like Outcome: Progressing   Problem: Inappropriate Communication Goal: The patient will have communication appropriate to place and/or for the person e.g. incoherence, non-communicative, nonsensical, or unintelligible speech Outcome: Progressing   Problem: Illusions/Hallucinations Goal: The patient will no longer see or hear things that are not there, distortion of visual objects Outcome: Progressing   Problem: Psychomotor Delay Goal: The patient will no longer have delayed responsiveness, few or spontaneous actions/word e.g. when patient is prodded, reaction is deferred and/or the patient is unarousable Outcome: Progressing

## 2024-03-24 NOTE — Progress Notes (Signed)
   03/24/24 0901  Discharge Plan  Second IMM Verbal delivery - physical copy sent by email (details in comment) (9:01a spoke withMiller,Fatima (Son or Daughter)  [phone] [facility]), to review. Contact information provided.)

## 2024-03-25 ENCOUNTER — Emergency Department (HOSPITAL_BASED_OUTPATIENT_CLINIC_OR_DEPARTMENT_OTHER)
Admission: EM | Admit: 2024-03-25 | Discharge: 2024-03-25 | Discharge status: Against Medical Advice | Attending: Emergency Medicine | Admitting: Emergency Medicine

## 2024-03-25 ENCOUNTER — Encounter (HOSPITAL_BASED_OUTPATIENT_CLINIC_OR_DEPARTMENT_OTHER): Payer: Self-pay

## 2024-03-25 ENCOUNTER — Other Ambulatory Visit: Payer: Self-pay

## 2024-03-25 DIAGNOSIS — T829XXA Unspecified complication of cardiac and vascular prosthetic device, implant and graft, initial encounter: Secondary | ICD-10-CM | POA: Diagnosis present

## 2024-03-25 DIAGNOSIS — Y828 Other medical devices associated with adverse incidents: Secondary | ICD-10-CM | POA: Diagnosis not present

## 2024-03-25 MED ORDER — SODIUM CHLORIDE 0.9 % IV BOLUS: 250.0000 mL | Freq: Three times a day (TID) | INTRAVENOUS | Status: DC

## 2024-03-25 MED ORDER — DEXTROSE 5 % IV SOLN: 500.0000 mg | Freq: Three times a day (TID) | INTRAVENOUS | Status: DC

## 2024-03-25 MED ORDER — SODIUM CHLORIDE 0.9 % IV SOLN: INTRAVENOUS | Status: DC

## 2024-03-25 MED ORDER — SODIUM CHLORIDE 0.9 % IV BOLUS
250.0000 mL | Freq: Once | INTRAVENOUS | Status: AC
  Administered 2024-03-25: 250 mL via INTRAVENOUS

## 2024-03-25 MED ORDER — DEXTROSE 5 % IV SOLN
500.0000 mg | Freq: Once | INTRAVENOUS | Status: AC
  Administered 2024-03-25: 500 mg via INTRAVENOUS

## 2024-03-25 NOTE — ED Notes (Signed)
 Patient still appearing on our trackboard, called daughter to inquire if they have made it, no answer, left voicemail.

## 2024-03-25 NOTE — ED Notes (Signed)
 Nurse has attempted to use PICC Line that was placed at [facility], there is blood noted in the lop all the way to the hub, it will not pull back any blood and has resistance.

## 2024-03-25 NOTE — ED Notes (Signed)
 Spoke with [employer] officer, [place], regarding patient not arriving to [facility]. Informed that we called and left messages. No further action at this time.

## 2024-03-25 NOTE — ED Provider Notes (Signed)
 [facility] Provider Note   CSN: [id number] Arrival date & time: 03/25/24  0046     Patient presents with: Vascular Access Problem   Penny Washington is a 63 y.o. female.   The history is provided by the patient.   Penny Washington is a 63 y.o. female who presents to the Emergency Department complaining of PICC line confusion. She presents to the emergency department after being discharged earlier today from [facility] with the PICC line in place. She has supplies to self administer acyclovir  TID for zoster encephalitis. When patient and daughter arrived home they do not know how to use the PICC line, so they presented with supplies for education. Patient does not have any acute complaints. Particularly no pain, confusion, numbness or weakness.   Prior to Admission medications   Medication Sig Start Date End Date Taking? Authorizing Provider  atenolol (TENORMIN) 50 MG tablet Take 25 mg by mouth 2 (two) times daily.     [provider]  fluconazole  (DIFLUCAN ) 150 MG tablet 1 tablet by mouth on the last day of Flagyl . 05/15/15   Lynwood Anes, MD  hydrochlorothiazide  (HYDRODIURIL ) 12.5 MG tablet Take 1 tablet (12.5 mg total) by mouth daily. 09/25/14   Palumbo, April, MD  hydrochlorothiazide  (MICROZIDE ) 12.5 MG capsule Take 25 mg by mouth daily.     [provider]  ibuprofen  (ADVIL ,MOTRIN ) 600 MG tablet Take 1 tablet (600 mg total) by mouth every 6 (six) hours as needed. 10/20/14   Kirichenko, Tatyana, PA-C  lisinopril-hydrochlorothiazide  (PRINZIDE,ZESTORETIC) 20-12.5 MG tablet Take 1 tablet by mouth daily.    [provider]  metroNIDAZOLE  (FLAGYL ) 500 MG tablet Take 1 tablet (500 mg total) by mouth 2 (two) times daily. 05/15/15   Lynwood Anes, MD  oxyCODONE -acetaminophen  (PERCOCET) 5-325 MG per tablet Take 1 tablet by mouth every 4 (four) hours as needed for severe pain. 10/20/14   Kirichenko, Tatyana, PA-C  sodium  chloride (OCEAN) 0.65 % SOLN nasal spray Place 2 sprays into both nostrils as needed for congestion. 09/25/14   Palumbo, April, MD  tamsulosin  (FLOMAX ) 0.4 MG CAPS capsule Take 1 capsule (0.4 mg total) by mouth daily. 10/20/14   Kirichenko, Tatyana, PA-C  triamcinolone  cream (KENALOG ) 0.1 % Apply 1 application topically 2 (two) times daily. 05/15/15   Lynwood Anes, MD    Allergies: Gramineae pollens and Lisinopril    Review of Systems  All other systems reviewed and are negative.   Updated Vital Signs BP (!) 174/113 (BP Location: Left Arm)   Pulse 92   Temp 98.1 F (36.7 C) (Oral)   Resp 16   SpO2 97%   Physical Exam Vitals and nursing note reviewed.  Constitutional:      Appearance: She is well-developed.  HENT:     Head: Normocephalic and atraumatic.  Cardiovascular:     Rate and Rhythm: Normal rate and regular rhythm.  Pulmonary:     Effort: Pulmonary effort is normal. No respiratory distress.  Musculoskeletal:        General: No tenderness.     Comments: Single lumen picc line in LUE  Skin:    General: Skin is warm and dry.  Neurological:     Mental Status: She is alert and oriented to person, place, and time.  Psychiatric:        Behavior: Behavior normal.     (all labs ordered are listed, but only abnormal results are displayed) Labs Reviewed - No data to display  EKG: None  Radiology: No results found.   Procedures   Medications Ordered in the ED  acyclovir  (ZOVIRAX ) 500 mg in dextrose  5 % 250 mL IVPB (0 mg Intravenous Stopped 03/25/24 0608)  sodium chloride  0.9 % bolus 250 mL (250 mLs Intravenous New Bag/Given 03/25/24 0608)                                    Medical Decision Making  Patient here for evaluation of PICC line and education for self administering medications. She does have a PICC line in the right upper extremity, nursing has attempted to flush this without success. Given she has missed two doses of her acyclovir  will place a peripheral  line so she can receive her dose. There is no PICC line team available at this facility, plan to transfer ED to ED to [facility], so PICC line team can see if this line can be fixed or if it will need to be replaced. Discussed with Dr. Midge and the [facility] emergency department, who accepts the patient and transport.     Final diagnoses:  Complication associated with peripherally inserted central catheter (PICC), initial encounter    ED Discharge Orders     None          Griselda Norris, MD 03/25/24 [phone]

## 2024-03-25 NOTE — ED Notes (Signed)
 IV secured for POV transfer. Patients daughter driving her directly to [facility] ED.

## 2024-03-25 NOTE — ED Notes (Signed)
 Called left message on voice mail to call us  back on whether she had changed her mind and not going to [facility] ED.

## 2024-03-25 NOTE — ED Notes (Signed)
 Report has been given the Charge nurse regarding the transfer. Charge nurse at the time of the call was Burnard Feeling, Charity fundraiser

## 2024-03-25 NOTE — ED Notes (Addendum)
 Pt is to receive Abx and flush prior to transferring to [facility] for PICC line evaluation

## 2024-03-25 NOTE — ED Triage Notes (Signed)
 Pt was discharged with a PICC line in place from the hospital today with minimal instructions on how to adminster the medication. The PICC line is now obstructed with visible blood in the line. Making it impossible to give her infusion medications at home.
